# Patient Record
Sex: Male | Born: 1970 | Race: White | Hispanic: No | Smoking: Former smoker
Health system: Southern US, Community
[De-identification: ages and names within clinical notes are randomized; demographics above are authoritative.]

## PROBLEM LIST (undated history)

## (undated) DIAGNOSIS — F329 Major depressive disorder, single episode, unspecified: Secondary | ICD-10-CM

## (undated) DIAGNOSIS — T7840XA Allergy, unspecified, initial encounter: Secondary | ICD-10-CM

## (undated) DIAGNOSIS — F419 Anxiety disorder, unspecified: Secondary | ICD-10-CM

## (undated) DIAGNOSIS — F32A Depression, unspecified: Secondary | ICD-10-CM

## (undated) DIAGNOSIS — I10 Essential (primary) hypertension: Secondary | ICD-10-CM

## (undated) DIAGNOSIS — E7800 Pure hypercholesterolemia, unspecified: Secondary | ICD-10-CM

## (undated) HISTORY — PX: FRACTURE SURGERY: SHX138

## (undated) HISTORY — DX: Essential (primary) hypertension: I10

## (undated) HISTORY — DX: Anxiety disorder, unspecified: F41.9

## (undated) HISTORY — PX: ANKLE FRACTURE SURGERY: SHX122

## (undated) HISTORY — DX: Depression, unspecified: F32.A

## (undated) HISTORY — DX: Allergy, unspecified, initial encounter: T78.40XA

## (undated) HISTORY — PX: APPENDECTOMY: SHX54

---
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
## 2006-11-03 ENCOUNTER — Emergency Department: Payer: Self-pay | Admitting: Emergency Medicine

## 2009-04-11 ENCOUNTER — Encounter: Admission: RE | Admit: 2009-04-11 | Discharge: 2009-04-11 | Payer: Self-pay | Admitting: Orthopedic Surgery

## 2009-08-22 ENCOUNTER — Encounter (INDEPENDENT_AMBULATORY_CARE_PROVIDER_SITE_OTHER): Payer: Self-pay | Admitting: *Deleted

## 2009-09-12 ENCOUNTER — Ambulatory Visit: Payer: Self-pay | Admitting: Gastroenterology

## 2009-09-12 DIAGNOSIS — R109 Unspecified abdominal pain: Secondary | ICD-10-CM | POA: Insufficient documentation

## 2009-09-12 LAB — CONVERTED CEMR LAB
Alkaline Phosphatase: 61 units/L (ref 39–117)
BUN: 17 mg/dL (ref 6–23)
Basophils Relative: 1.7 % (ref 0.0–3.0)
Creatinine, Ser: 1 mg/dL (ref 0.4–1.5)
Eosinophils Relative: 4.7 % (ref 0.0–5.0)
Glucose, Bld: 79 mg/dL (ref 70–99)
IgA: 177 mg/dL (ref 68–378)
Lymphocytes Relative: 29.9 % (ref 12.0–46.0)
Neutrophils Relative %: 56.7 % (ref 43.0–77.0)
Sed Rate: 12 mm/hr (ref 0–22)
Tissue Transglutaminase Ab, IgA: 0.2 units (ref <7)
Total Bilirubin: 0.5 mg/dL (ref 0.3–1.2)

## 2009-09-14 ENCOUNTER — Ambulatory Visit: Payer: Self-pay | Admitting: Internal Medicine

## 2009-09-19 ENCOUNTER — Telehealth: Payer: Self-pay | Admitting: Gastroenterology

## 2009-10-02 ENCOUNTER — Encounter (INDEPENDENT_AMBULATORY_CARE_PROVIDER_SITE_OTHER): Payer: Self-pay | Admitting: *Deleted

## 2009-10-03 ENCOUNTER — Ambulatory Visit: Payer: Self-pay | Admitting: Gastroenterology

## 2009-10-10 ENCOUNTER — Ambulatory Visit: Payer: Self-pay | Admitting: Gastroenterology

## 2009-11-20 ENCOUNTER — Ambulatory Visit: Payer: Self-pay | Admitting: Gastroenterology

## 2010-08-20 NOTE — Assessment & Plan Note (Signed)
History of Present Illness Visit Type: Initial Visit Primary GI MD: Rob Bunting MD Primary Provider: n/a Chief Complaint: abdominal pain radiating to back History of Present Illness:     very pleasant 40 year old man who is here with upper back pain that are always relieved when he has a BM.  who has been having very bad back problems right.left upper back pains.  He will have a BM and this will help his pain.    He has spinal MRI at Dr. Cheron Schaumann office. Had a CT chest at [facility]. tells me these were normal  Has not had abd imaging.    recently had blood test by PCP.  He will usually have 6-7 formed, stools, every day (non-bloody).  This has been his pattern for a long time (at least into his twenties.  Was told he had colitis in college: present with abd pains, increased stool frequency.  Did not have colonoscopy.  Only an xray was done. had symptoms for 8-9 months (no bleeding at that time).  Overall he has been losing weight, unintentially 20 pounds in a year.             Current Medications (verified): 1)  None  Allergies (verified): 1)  ! * Peanuts 2)  ! * Milk  Past History:  Past Medical History: hypertension Colitis? when in college  Past Surgical History: none  Family History: no colon cancer No colitis  Social History: he is married he has a 21-year-old son he works as an Metallurgist setting up Therapist, nutritional for concerts, he smokes less than one pack a day, he does not drink alcohol, he drinks 3-4 caffeinated beverages a day  Review of Systems       Pertinent positive and negative review of systems were noted in the above HPI and GI specific review of systems.  All other review of systems was otherwise negative.   Vital Signs:  Patient profile:   40 year old male Height:      71 inches Weight:      191.13 pounds BMI:     26.75 Pulse rate:   80 / minute Pulse rhythm:   regular BP sitting:   148 / 108  (left arm)  Vitals Entered  By: Milford Cage NCMA (September 12, 2009 11:06 AM)  Physical Exam  Additional Exam:  Constitutional: generally well appearing Psychiatric: alert and oriented times 3 Eyes: extraocular movements intact Mouth: oropharynx moist, no lesions Neck: supple, no lymphadenopathy Cardiovascular: heart regular rate and rythm Lungs: CTA bilaterally Abdomen: soft, non-tender, non-distended, no obvious ascites, no peritoneal signs, normal bowel sounds Extremities: no lower extremity edema bilaterally Skin: no lesions on visible extremities    Impression & Recommendations:  Problem # 1:  Back pain that is always relieved when he has a BM this is unusual complex of symptoms. Perhaps he has a diaphragmatic hernia with some of his colon in his chest and tinging on the spine. That could explain his symptoms. I would've expected that to show up on a chest CT scan however. I think it is reasonable for him to have a abdominal, pelvic CT scan to check for anatomic abnormality such as this. If that is negative then we will proceed with colonoscopy at his soonest convenience. He does have his question of colitis in his college years although he never underwent any type of tissue sampling, flexible sigmoidoscopy or colonoscopy to prove it. He'll get a basic set of labs including a CBC,  complete metabolic profile and celiac sprue testing.  Other Orders: TLB-CBC Platelet - w/Differential (85025-CBCD) TLB-CMP (Comprehensive Metabolic Pnl) (80053-COMP) TLB-Sedimentation Rate (ESR) (85652-ESR) T-Tissue Transglutamase Ab IgA (84132-44010) TLB-IgA (Immunoglobulin A) (82784-IGA)  Patient Instructions: 1)  You will get lab test(s) done today (cbc, cmet, esr, ttg, total IgA level). 2)  You will be scheduled for a CT scan of abdomen and pelvis with IV and oral contrast (diaphragmatic hernia?). 3)  IF the CT is normal, you will need a colonoscopy. 4)  The medication list was reviewed and reconciled.  All changed / newly  prescribed medications were explained.  A complete medication list was provided to the patient / caregiver.  Appended Document: Orders Update/CT    Clinical Lists Changes  Orders: Added new Referral order of CT Abdomen/Pelvis with Contrast (CT Abd/Pelvis w/con) - Signed      Appended Document:  labs all normal.  please call.  Should continue with the suggestions outlined at recent visit.  Appended Document:  pt aware

## 2010-08-20 NOTE — Miscellaneous (Signed)
Summary: [facility]  Clinical Lists Changes  Medications: Added new medication of MOVIPREP 100 GM  SOLR (PEG-KCL-NACL-NASULF-NA ASC-C) As per prep instructions. - Signed Rx of MOVIPREP 100 GM  SOLR (PEG-KCL-NACL-NASULF-NA ASC-C) As per prep instructions.;  #1 x 0;  Signed;  Entered by: Ezra Sites RN;  Authorized by: Rachael Fee MD;  Method used: Electronically to [facility]. # [phone]*, [address], [place], [place], [place]  [address], Ph: [phone], Fax: [phone] Observations: Added new observation of ALLERGY REV: Done (10/03/2009 13:13)    Prescriptions: MOVIPREP 100 GM  SOLR (PEG-KCL-NACL-NASULF-NA ASC-C) As per prep instructions.  #1 x 0   Entered by:   Ezra Sites RN   Authorized by:   Rachael Fee MD   Signed by:   Ezra Sites RN on 10/03/2009   Method used:   Electronically to        [facility]. # [phone]* (retail)       [address]       [place], [place]  [address]       Ph: [phone]       Fax: [phone]   RxID:   [id number]

## 2010-08-20 NOTE — Letter (Signed)
Summary: New Patient letter  [facility]  [address] [place], [place] [address]   Phone: [phone]  Fax: [phone]       08/22/2009 MRN: [id number]  Bryan Mccoy [address] [place], [place]  [address]  Dear Bryan Mccoy,  Welcome to the Gastroenterology Division at [facility]    You are scheduled to see Dr. Rob Bunting on September 12, 2009 at 11:00am  on the 3rd floor at [facility], [address]  We ask that you try to arrive at our office 15 minutes prior to your appointment time to allow for check-in.  We would like you to complete the enclosed self-administered evaluation form prior to your visit and bring it with you on the day of your appointment.  We will review it with you.  Also, please bring a complete list of all your medications or, if you prefer, bring the medication bottles and we will list them.  Please bring your insurance card so that we may make a copy of it.  If your insurance requires a referral to see a specialist, please bring your referral form from your primary care physician.  Co-payments are due at the time of your visit and may be paid by cash, check or credit card.     Your office visit will consist of a consult with your physician (includes a physical exam), any laboratory testing he/she may order, scheduling of any necessary diagnostic testing (e.g. x-ray, ultrasound, CT-scan), and scheduling of a procedure (e.g. Endoscopy, Colonoscopy) if required.  Please allow enough time on your schedule to allow for any/all of these possibilities.    If you cannot keep your appointment, please call [phone] to cancel or reschedule prior to your appointment date.  This allows [place] the opportunity to schedule an appointment for another patient in need of care.  If you do not cancel or reschedule by 5 p.m. the business day prior to your appointment date, you will be charged a $50.00 late cancellation/no-show fee.    Thank you for  choosing [facility] for your medical needs.  We appreciate the opportunity to care for you.  Please visit [place] at our website  to learn more about our practice.                     Sincerely,                                                             The Gastroenterology Division

## 2010-08-20 NOTE — Letter (Signed)
Summary: [facility] Instructions  [facility]  [address] [place], [place] [address]   Phone: [phone]  Fax: [phone]       Bryan Mccoy    [birth year]    MRN: [id number]        Procedure Day /Date:  10/10/09     Arrival Time:  1pm      Procedure Time:  2pm     Location of Procedure:                    _x _  [facility] (4th Floor)   PREPARATION FOR COLONOSCOPY WITH MOVIPREP   Starting 5 days prior to your procedure _3/18/11 _ do not eat nuts, seeds, popcorn, corn, beans, peas,  salads, or any raw vegetables.  Do not take any fiber supplements (e.g. Metamucil, Citrucel, and Benefiber).  THE DAY BEFORE YOUR PROCEDURE         DATE:  10/09/09  DAY:   Tuesday  1.  Drink clear liquids the entire day-NO SOLID FOOD  2.  Do not drink anything colored red or purple.  Avoid juices with pulp.  No orange juice.  3.  Drink at least 64 oz. (8 glasses) of fluid/clear liquids during the day to prevent dehydration and help the prep work efficiently.  CLEAR LIQUIDS INCLUDE: Water Jello Ice Popsicles Tea (sugar ok, no milk/cream) Powdered fruit flavored drinks Coffee (sugar ok, no milk/cream) Gatorade Juice: apple, white grape, white cranberry  Lemonade Clear bullion, consomm, broth Carbonated beverages (any kind) Strained chicken noodle soup Hard Candy                             4.  In the morning, mix first dose of MoviPrep solution:    Empty 1 Pouch A and 1 Pouch B into the disposable container    Add lukewarm drinking water to the top line of the container. Mix to dissolve    Refrigerate (mixed solution should be used within 24 hrs)  5.  Begin drinking the prep at 5:00 p.m. The MoviPrep container is divided by 4 marks.   Every 15 minutes drink the solution down to the next mark (approximately 8 oz) until the full liter is complete.   6.  Follow completed prep with 16 oz of clear liquid of your choice (Nothing red or purple).  Continue to  drink clear liquids until bedtime.  7.  Before going to bed, mix second dose of MoviPrep solution:    Empty 1 Pouch A and 1 Pouch B into the disposable container    Add lukewarm drinking water to the top line of the container. Mix to dissolve    Refrigerate  THE DAY OF YOUR PROCEDURE      DATE:   10/10/09 DAY:   Wednesday  Beginning at  9:00 a.m. (5 hours before procedure):         1. Every 15 minutes, drink the solution down to the next mark (approx 8 oz) until the full liter is complete.  2. Follow completed prep with 16 oz. of clear liquid of your choice.    3. You may drink clear liquids until  12n (2 HOURS BEFORE PROCEDURE).   MEDICATION INSTRUCTIONS  Unless otherwise instructed, you should take regular prescription medications with a small sip of water   as early as possible the morning of your procedure.         OTHER  INSTRUCTIONS  You will need a responsible adult at least 40 years of age to accompany you and drive you home.   This person must remain in the waiting room during your procedure.  Wear loose fitting clothing that is easily removed.  Leave jewelry and other valuables at home.  However, you may wish to bring a book to read or  an iPod/MP3 player to listen to music as you wait for your procedure to start.  Remove all body piercing jewelry and leave at home.  Total time from sign-in until discharge is approximately 2-3 hours.  You should go home directly after your procedure and rest.  You can resume normal activities the  day after your procedure.  The day of your procedure you should not:   Drive   Make legal decisions   Operate machinery   Drink alcohol   Return to work  You will receive specific instructions about eating, activities and medications before you leave.    The above instructions have been reviewed and explained to me by   Ezra Sites RN  October 03, 2009 2:02 PM    I fully understand and can verbalize these  instructions _____________________________ Date _________

## 2010-08-20 NOTE — Procedures (Signed)
Summary: Colonoscopy  Patient: Bryan Mccoy Note: All result statuses are Final unless otherwise noted.  Tests: (1) Colonoscopy (COL)   COL Colonoscopy           DONE     [facility]     [address]     [place], [place]  [address]           COLONOSCOPY PROCEDURE REPORT           PATIENT:  Bryan Mccoy, Bryan Mccoy  MR#:  [id number]     BIRTHDATE:  06-Sep-1970, 38 yrs. old  GENDER:  male     ENDOSCOPIST:  Rachael Fee, MD           PROCEDURE DATE:  10/10/2009     PROCEDURE:  Colonoscopy with biopsy     ASA CLASS:  Class II     INDICATIONS:  chronic loose stools; back pain that is always     improved with BM     MEDICATIONS:   Fentanyl 75 mcg IV, Versed 7 mg IV     DESCRIPTION OF PROCEDURE:   After the risks benefits and     alternatives of the procedure were thoroughly explained, informed     consent was obtained.  Digital rectal exam was performed and     revealed no rectal masses.   The LB [id number] endoscope     was introduced through the anus and advanced to the terminal ileum     which was intubated for a short distance, without limitations.     The quality of the prep was good, using MoviPrep.  The instrument     was then slowly withdrawn as the colon was fully examined.     <<PROCEDUREIMAGES>>     FINDINGS:  The terminal ileum appeared normal (see image3).  A     normal appearing cecum, ileocecal valve, and appendiceal orifice     were identified. The ascending, hepatic flexure, transverse,     splenic flexure, descending, sigmoid colon, and rectum appeared     unremarkable. Random colon biopsies taken (pathology jar 1) (see     image1, image2, and image4).   Retroflexed views in the rectum     revealed no abnormalities.    The scope was then withdrawn from     the patient and the procedure completed.           COMPLICATIONS:  None     ENDOSCOPIC IMPRESSION:     1) Normal terminal ileum     2) Normal colon, random biopsies taken        RECOMMENDATIONS:     Await biopsies.  Will start appropriate medicines pending biopsy     reports (budesonide if microscopic colitis found, scheduled     immodium if no microscopic colitis.     I do not see any explanation for his back pain (CT scan recently     was normal).           ______________________________     Rachael Fee, MD           n.     eSIGNED:   Rachael Fee at 10/10/2009 02:25 PM           Turley, Onalee Hua, [id number]  Note: An exclamation mark (!) indicates a result that was not dispersed into the flowsheet. Document Creation Date: 10/10/2009 2:25 PM _______________________________________________________________________  (1) Order result status: Final Collection or observation date-time: 10/10/2009 14:17 Requested date-time:  Receipt  date-time:  Reported date-time:  Referring Physician:   Ordering Physician: Rob Bunting [phone]) Specimen Source:  Source: Launa Grill Order Number: [id number] Lab site:

## 2010-08-20 NOTE — Progress Notes (Signed)
Summary: results  Phone Note Call from Patient Call back at Home Phone [phone]   Caller: Patient Call For: Christella Hartigan Reason for Call: Talk to Nurse Summary of Call: Patient wants ct results from 2-25  Initial call taken by: Tawni Levy,  September 19, 2009 11:23 AM  Follow-up for Phone Call        pt informed that Dr Christella Hartigan was out of town and would review the CT as soon as he is back.  I will see if the [facility] will review. ( Dr Marina Goodell) Follow-up by: Chales Abrahams CMA Duncan Dull),  September 19, 2009 12:12 PM  Additional Follow-up for Phone Call Additional follow up Details #1::        Dr Marina Goodell reviewed and results given to the pt. He will call back to schedule colon. Additional Follow-up by: Chales Abrahams CMA Duncan Dull),  September 19, 2009 12:28 PM     Appended Document: results patty, please set up colonoscopy.  also let him know that his labs have all returned normal as well.  Appended Document: results spoke with the pt he will call his wife and call back this afternoon to schedule  Appended Document: results pt scheduled for colon and previsit

## 2010-08-20 NOTE — Assessment & Plan Note (Signed)
  Review of gastrointestinal problems: 1. Chronic loose stools, back pain is always relieved when he moves his bowels. Colonoscopy March 2011 was normal to the terminal ileum. Random biopsies were normal.  CT Scan showed no obvious colon or small bowel pathology.    History of Present Illness Visit Type: Follow-up Visit Primary GI MD: Rob Bunting MD Primary Provider: n/a Chief Complaint: follow-up colonoscopy still not any better and losing weight History of Present Illness:     very pleasant 40 year old man whom I last saw the time of his colonoscopy. See those results above.  he started one immodium and became a bit constipated.  The back pains became worse also.  He stopped the immodium.    GI Review of Systems      Denies vomiting blood.         Current Medications (verified): 1)  None  Allergies (verified): 1)  ! * Peanuts 2)  ! * Milk  Vital Signs:  Patient profile:   40 year old male Height:      71 inches Weight:      188.25 pounds BMI:     26.35 Pulse rate:   80 / minute Pulse rhythm:   regular BP sitting:   158 / 100  (left arm)  Vitals Entered By: Milford Cage NCMA (Nov 20, 2009 1:53 PM)  Physical Exam  Additional Exam:  Constitutional: generally well appearing Psychiatric: alert and oriented times 3 Abdomen: soft, non-tender, non-distended, normal bowel sounds    Impression & Recommendations:  Problem # 1:  chronic loose stools, intermittent back pain his back pain is usually relieved when he moves his bowels. Is also quite positional and improves when he lays flat on his back for 10-15 minutes. He has undergone orthopedic, spinal workup and they do not think it is related to his spine. I've certainly not seen colon or intestinal spasm present with back pain before but perhaps that is indeed what is going on here. I have given him prescription for sublingual antispasmodics and she will take on an as-needed basis and he will contact me if things  worsen or do not improve with this medicine.  Patient Instructions: 1)  Cutting back on caffeine can decrease loose stools. 2)  Quitting smoking may help as well. 3)  We've called in an antispasm medicine, take as needed. Call Dr. Christella Hartigan if things worsen or aren't getting better on the new medicine. 4)  The medication list was reviewed and reconciled.  All changed / newly prescribed medications were explained.  A complete medication list was provided to the patient / caregiver. Prescriptions: LEVSIN/SL 0.125 MG  SUBL (HYOSCYAMINE SULFATE) take one to two pills every 5-6 hours as needed  #75 x 3   Entered and Authorized by:   Rachael Fee MD   Signed by:   Rachael Fee MD on 11/20/2009   Method used:   Electronically to        [facility]. # [phone]* (retail)       [address]       [place], [place]  [address]       Ph: [phone]       Fax: [phone]   RxID:   [id number]

## 2011-03-08 IMAGING — CT CT ABD-PELV W/ CM
2 of 4 series · 17 of 46 positions shown, 19 images · IV contrast (Omnipaque 300)
Comparison: None

CLINICAL DATA: Abdominal pain.  Weight loss.

CT ABDOMEN AND PELVIS WITH CONTRAST
TECHNIQUE: Multidetector CT imaging of the abdomen and pelvis was
performed following the standard protocol during bolus
administration of intravenous contrast.
Contrast: 100 ml of 8mnipaque-4GG

[Series 2: abd/ pel · axial · 0.80mm/px · z∈[-473,-43]mm · 14 of 94 slices shown, 16 images]
[im 4/94  soft-tissue]
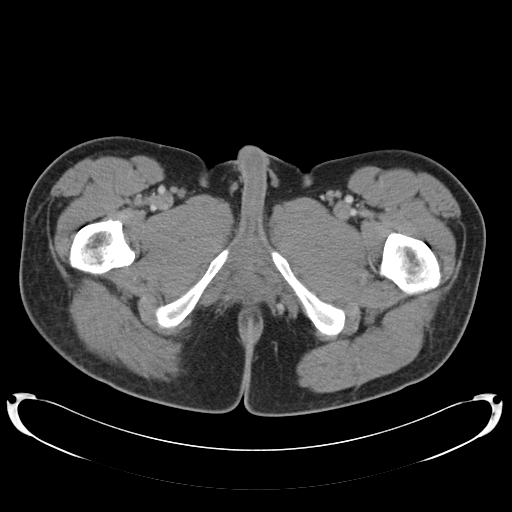
[im 4/94  bone]
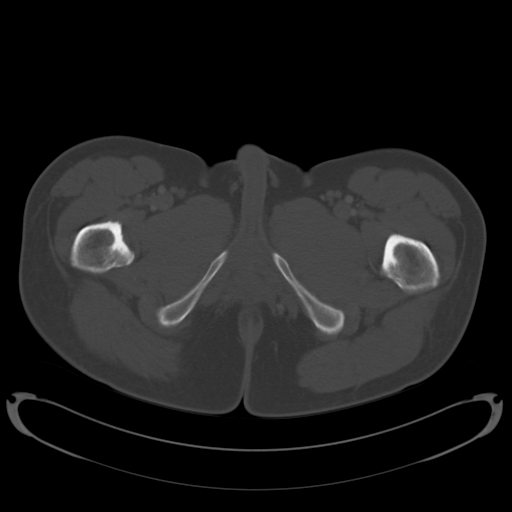
[im 12/94  soft-tissue]
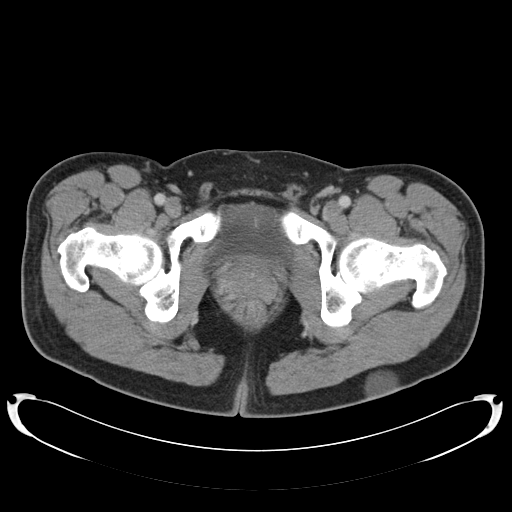
[im 20/94  soft-tissue]
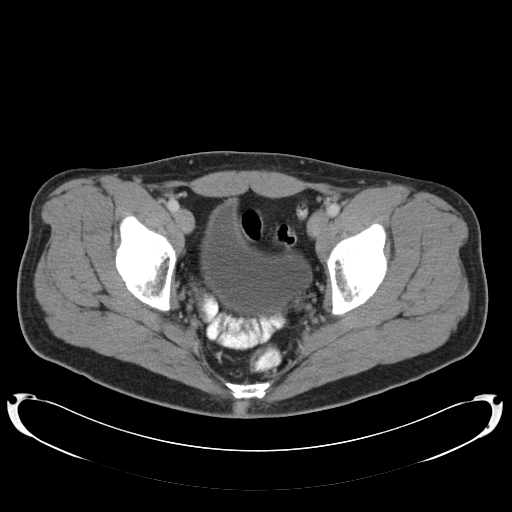
[im 24/94  soft-tissue]
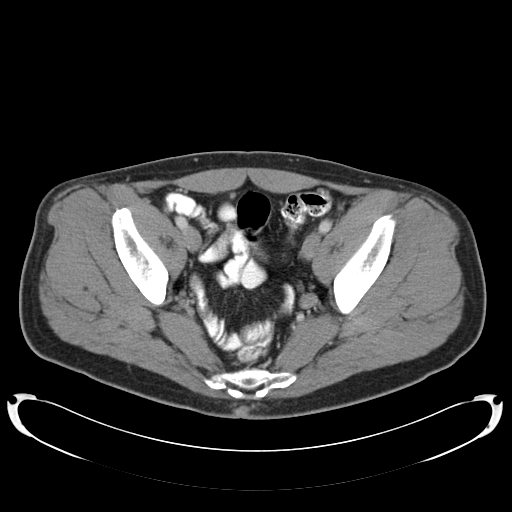
[im 32/94  soft-tissue]
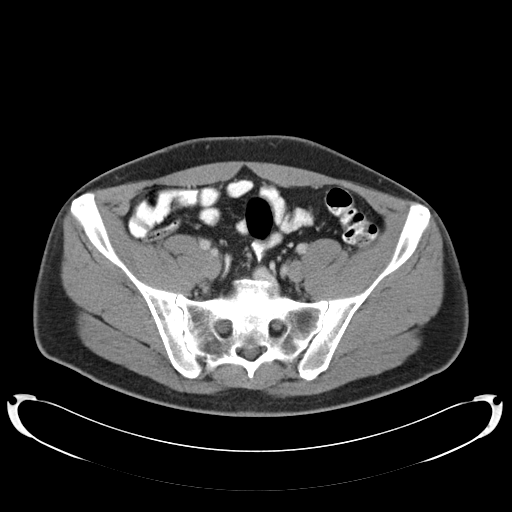
[im 39/94  soft-tissue]
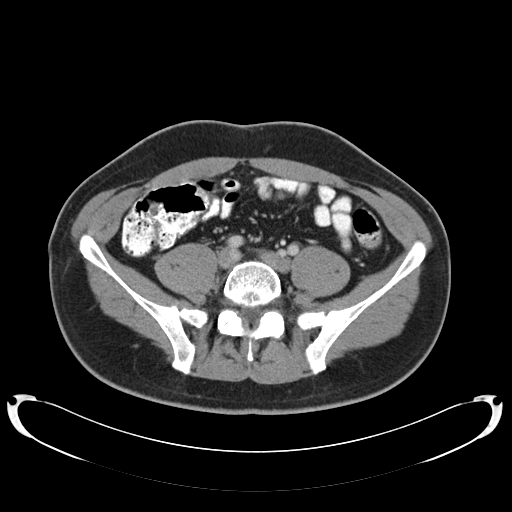
[im 43/94  soft-tissue]
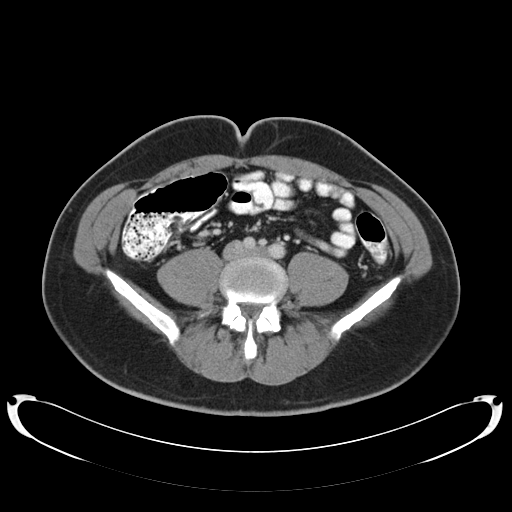
[im 51/94  soft-tissue]
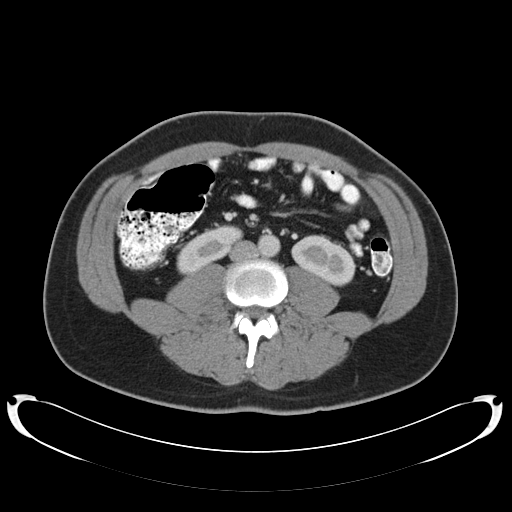
[im 55/94  soft-tissue]
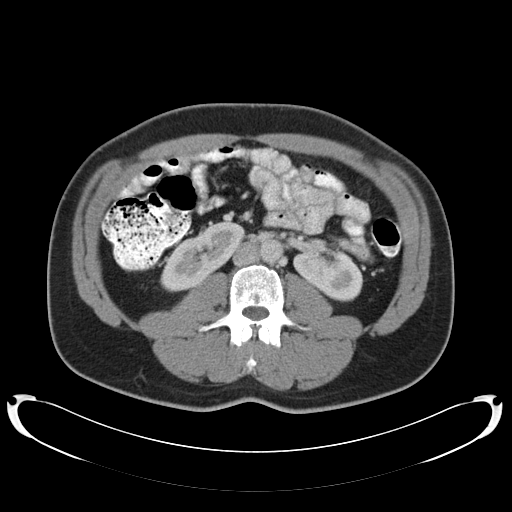
[im 55/94  bone]
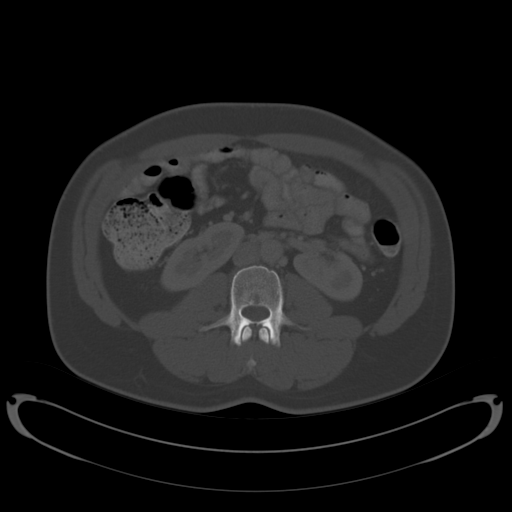
[im 63/94  soft-tissue]
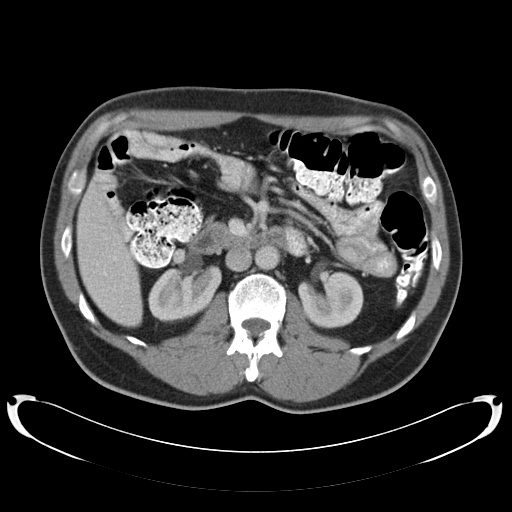
[im 70/94  soft-tissue]
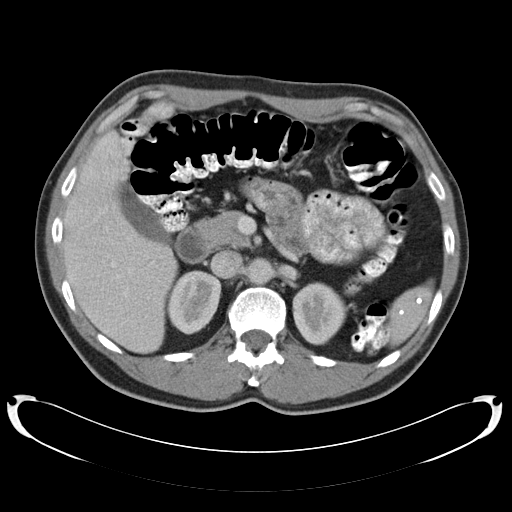
[im 74/94  soft-tissue]
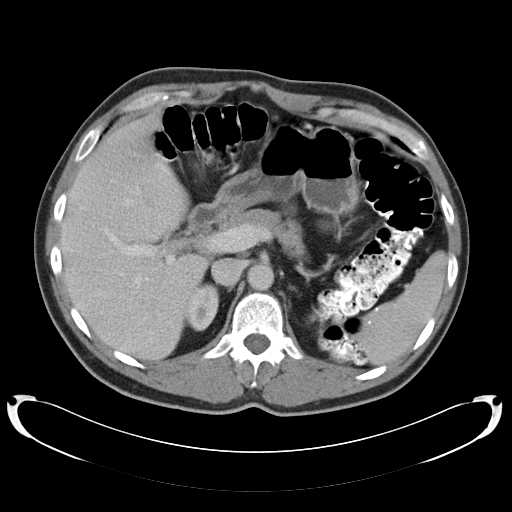
[im 82/94  soft-tissue]
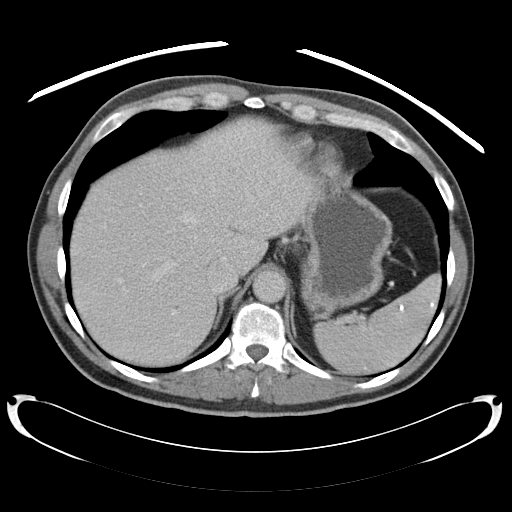
[im 90/94  soft-tissue]
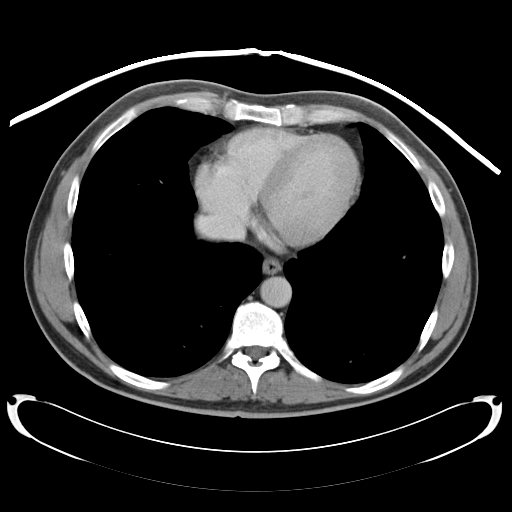

[Series 602: <mpr range> · coronal · 0.93mm/px · 3 of 141 slices shown]
[im 47/141  soft-tissue]
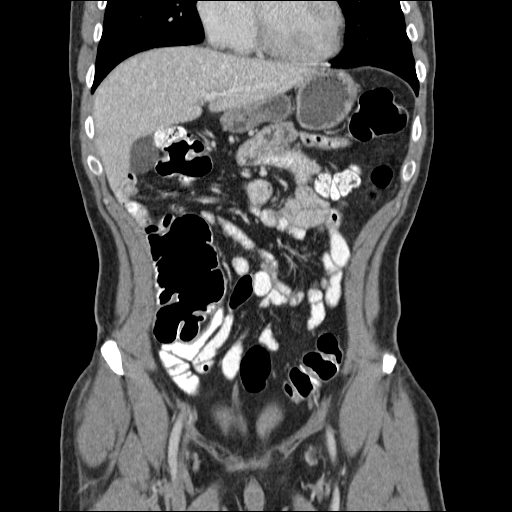
[im 63/141  soft-tissue]
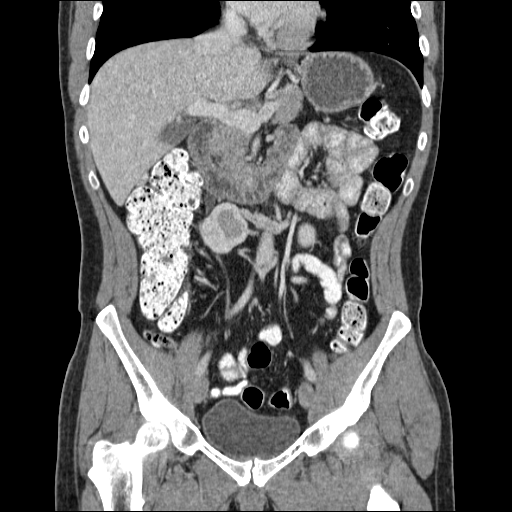
[im 78/141  soft-tissue]
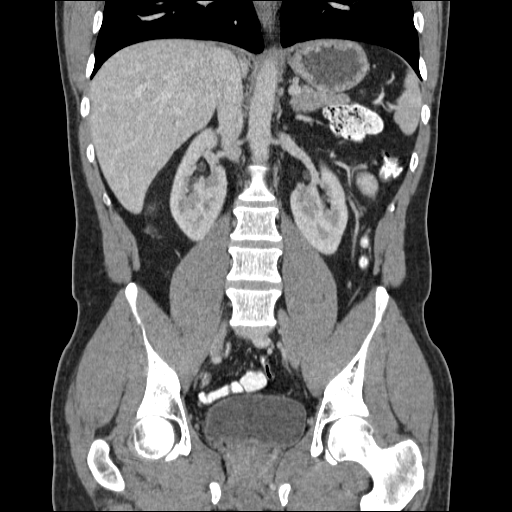

[17 of 46 positions shown; findings below may reference images not displayed]

FINDINGS: Lung bases:Clear. The diaphragm is normal.  No hernia is seen.

Solid abdominal organs:The liver is unremarkable.  No biliary
dilatation.  The gallbladder is normal.  There are numerous small
calcified granulomas in the spleen.  The spleen is normal in size.
The pancreas, adrenal glands and kidneys are unremarkable.  Both
kidneys demonstrate mild under-rotation and there appears to be a
band of tissue connected and inferiorly this is likely a form of
horseshoe kidney.

GI tract:The stomach, duodenum, small bowel and colon are
unremarkable.  No mesenteric mass or adenopathy.  The appendix is
normal.  The rectum and sigmoid colon are unremarkable.

Aorta/retroperitoneal:The aorta is normal in caliber.  No
dissection.  The major branch vessels are normal.  No
retroperitoneal mass or adenopathy.

A circumaortic left renal vein is noted.

Pelvis:The bladder, prostate gland and seminal vesicles are
unremarkable.  No inguinal hernia. There is a large subcutaneous
cystic lesion in the left buttock area overlying the gluteus
maximus muscle which is likely a large sebaceous cyst.

Bony structures:Unremarkable.
IMPRESSION: 1.  No acute abdominal/pelvic findings, mass lesions or adenopathy.
2.  A variant of a horseshoe kidney deformity is noted
3.  4 cm probable sebaceous cyst in the left buttock area.

## 2019-06-02 ENCOUNTER — Encounter: Payer: Self-pay | Admitting: Family Medicine

## 2019-06-02 ENCOUNTER — Other Ambulatory Visit: Payer: Self-pay

## 2019-06-02 ENCOUNTER — Ambulatory Visit (INDEPENDENT_AMBULATORY_CARE_PROVIDER_SITE_OTHER): Payer: 59 | Admitting: Family Medicine

## 2019-06-02 VITALS — BP 132/84 | HR 82 | Temp 98.5°F | Ht 71.0 in | Wt 220.4 lb

## 2019-06-02 DIAGNOSIS — I10 Essential (primary) hypertension: Secondary | ICD-10-CM

## 2019-06-02 DIAGNOSIS — Z87891 Personal history of nicotine dependence: Secondary | ICD-10-CM

## 2019-06-02 DIAGNOSIS — Z7689 Persons encountering health services in other specified circumstances: Secondary | ICD-10-CM | POA: Diagnosis not present

## 2019-06-02 MED ORDER — ENALAPRIL-HYDROCHLOROTHIAZIDE 5-12.5 MG PO TABS: 1.0000 | ORAL_TABLET | Freq: Every day | ORAL | 1 refills | Status: DC

## 2019-06-02 NOTE — Patient Instructions (Addendum)
If you have lab work done today you will be contacted with your lab results within the next 2 weeks.  If you have not heard from [place] then please contact us. The fastest way to get your results is to register for My Chart.   IF you received an x-ray today, you will receive an invoice from [facility]. Please contact [facility] at [phone] with questions or concerns regarding your invoice.   IF you received labwork today, you will receive an invoice from [employer]. Please contact [facility] at [phone] with questions or concerns regarding your invoice.   Our billing staff will not be able to assist you with questions regarding bills from these companies.  You will be contacted with the lab results as soon as they are available. The fastest way to get your results is to activate your My Chart account. Instructions are located on the last page of this paperwork. If you have not heard from [place] regarding the results in 2 weeks, please contact this office.     DASH Eating Plan DASH stands for "Dietary Approaches to Stop Hypertension." The DASH eating plan is a healthy eating plan that has been shown to reduce high blood pressure (hypertension). It may also reduce your risk for type 2 diabetes, heart disease, and stroke. The DASH eating plan may also help with weight loss. What are tips for following this plan?  General guidelines  Avoid eating more than 2,300 mg (milligrams) of salt (sodium) a day. If you have hypertension, you may need to reduce your sodium intake to 1,500 mg a day.  Limit alcohol intake to no more than 1 drink a day for nonpregnant women and 2 drinks a day for men. One drink equals 12 oz of beer, 5 oz of wine, or 1 oz of hard liquor.  Work with your health care provider to maintain a healthy body weight or to lose weight. Ask what an ideal weight is for you.  Get at least 30 minutes of exercise that causes your heart to beat faster (aerobic  exercise) most days of the week. Activities may include walking, swimming, or biking.  Work with your health care provider or diet and nutrition specialist (dietitian) to adjust your eating plan to your individual calorie needs. Reading food labels   Check food labels for the amount of sodium per serving. Choose foods with less than 5 percent of the Daily Value of sodium. Generally, foods with less than 300 mg of sodium per serving fit into this eating plan.  To find whole grains, look for the word "whole" as the first word in the ingredient list. Shopping  Buy products labeled as "low-sodium" or "no salt added."  Buy fresh foods. Avoid canned foods and premade or frozen meals. Cooking  Avoid adding salt when cooking. Use salt-free seasonings or herbs instead of table salt or sea salt. Check with your health care provider or pharmacist before using salt substitutes.  Do not fry foods. Cook foods using healthy methods such as baking, boiling, grilling, and broiling instead.  Cook with heart-healthy oils, such as olive, canola, soybean, or sunflower oil. Meal planning  Eat a balanced diet that includes: ? 5 or more servings of fruits and vegetables each day. At each meal, try to fill half of your plate with fruits and vegetables. ? Up to 6-8 servings of whole grains each day. ? Less than 6 oz of lean meat, poultry, or fish each day. A 3-oz serving  of meat is about the same size as a deck of cards. One egg equals 1 oz. ? 2 servings of low-fat dairy each day. ? A serving of nuts, seeds, or beans 5 times each week. ? Heart-healthy fats. Healthy fats called Omega-3 fatty acids are found in foods such as flaxseeds and coldwater fish, like sardines, salmon, and mackerel.  Limit how much you eat of the following: ? Canned or prepackaged foods. ? Food that is high in trans fat, such as fried foods. ? Food that is high in saturated fat, such as fatty meat. ? Sweets, desserts, sugary drinks,  and other foods with added sugar. ? Full-fat dairy products.  Do not salt foods before eating.  Try to eat at least 2 vegetarian meals each week.  Eat more home-cooked food and less restaurant, buffet, and fast food.  When eating at a restaurant, ask that your food be prepared with less salt or no salt, if possible. What foods are recommended? The items listed may not be a complete list. Talk with your dietitian about what dietary choices are best for you. Grains Whole-grain or whole-wheat bread. Whole-grain or whole-wheat pasta. Brown rice. [place]. Bulgur. Whole-grain and low-sodium cereals. Pita bread. Low-fat, low-sodium crackers. Whole-wheat flour tortillas. Vegetables Fresh or frozen vegetables (raw, steamed, roasted, or grilled). Low-sodium or reduced-sodium tomato and vegetable juice. Low-sodium or reduced-sodium tomato sauce and tomato paste. Low-sodium or reduced-sodium canned vegetables. Fruits All fresh, dried, or frozen fruit. Canned fruit in natural juice (without added sugar). Meat and other protein foods Skinless chicken or [place]. Ground chicken or [place]. Pork with fat trimmed off. Fish and seafood. Egg whites. Dried beans, peas, or lentils. Unsalted nuts, nut butters, and seeds. Unsalted canned beans. Lean cuts of beef with fat trimmed off. Low-sodium, lean deli meat. Dairy Low-fat (1%) or fat-free (skim) milk. Fat-free, low-fat, or reduced-fat cheeses. Nonfat, low-sodium ricotta or cottage cheese. Low-fat or nonfat yogurt. Low-fat, low-sodium cheese. Fats and oils Soft margarine without trans fats. Vegetable oil. Low-fat, reduced-fat, or light mayonnaise and salad dressings (reduced-sodium). Canola, safflower, olive, soybean, and sunflower oils. Avocado. Seasoning and other foods Herbs. Spices. Seasoning mixes without salt. Unsalted popcorn and pretzels. Fat-free sweets. What foods are not recommended? The items listed may not be a complete list. Talk with your  dietitian about what dietary choices are best for you. Grains Baked goods made with fat, such as croissants, muffins, or some breads. Dry pasta or rice meal packs. Vegetables Creamed or fried vegetables. Vegetables in a cheese sauce. Regular canned vegetables (not low-sodium or reduced-sodium). Regular canned tomato sauce and paste (not low-sodium or reduced-sodium). Regular tomato and vegetable juice (not low-sodium or reduced-sodium). Angie Fava. Olives. Fruits Canned fruit in a light or heavy syrup. Fried fruit. Fruit in cream or butter sauce. Meat and other protein foods Fatty cuts of meat. Ribs. Fried meat. Berniece Salines. Sausage. Bologna and other processed lunch meats. Salami. Fatback. Hotdogs. Bratwurst. Salted nuts and seeds. Canned beans with added salt. Canned or smoked fish. Whole eggs or egg yolks. Chicken or [place] with skin. Dairy Whole or 2% milk, cream, and half-and-half. Whole or full-fat cream cheese. Whole-fat or sweetened yogurt. Full-fat cheese. Nondairy creamers. Whipped toppings. Processed cheese and cheese spreads. Fats and oils Butter. Stick margarine. Lard. Shortening. Ghee. Bacon fat. Tropical oils, such as coconut, palm kernel, or palm oil. Seasoning and other foods Salted popcorn and pretzels. Onion salt, garlic salt, seasoned salt, table salt, and sea salt. Worcestershire sauce. Tartar sauce. Barbecue sauce.  Teriyaki sauce. Soy sauce, including reduced-sodium. Steak sauce. Canned and packaged gravies. Fish sauce. Oyster sauce. Cocktail sauce. Horseradish that you find on the shelf. Ketchup. Mustard. Meat flavorings and tenderizers. Bouillon cubes. Hot sauce and Tabasco sauce. Premade or packaged marinades. Premade or packaged taco seasonings. Relishes. Regular salad dressings. Where to find more information:  National Heart, Lung, and [place]: https://wilson-eaton.com/  American Heart Association: www.heart.org Summary  The DASH eating plan is a healthy eating plan that has  been shown to reduce high blood pressure (hypertension). It may also reduce your risk for type 2 diabetes, heart disease, and stroke.  With the DASH eating plan, you should limit salt (sodium) intake to 2,300 mg a day. If you have hypertension, you may need to reduce your sodium intake to 1,500 mg a day.  When on the DASH eating plan, aim to eat more fresh fruits and vegetables, whole grains, lean proteins, low-fat dairy, and heart-healthy fats.  Work with your health care provider or diet and nutrition specialist (dietitian) to adjust your eating plan to your individual calorie needs. This information is not intended to replace advice given to you by your health care provider. Make sure you discuss any questions you have with your health care provider. Document Released: 06/26/2011 Document Revised: 06/19/2017 Document Reviewed: 06/30/2016 Elsevier Patient Education  2020 [employer].

## 2019-06-02 NOTE — Progress Notes (Signed)
11/12/202010:01 AM  Bryan Mccoy April 26, 1971, 48 y.o., male [id number]  Chief Complaint  Patient presents with  . Establish Care    moved from [place], completed release form. Taking htn meds, has 2 days left. Taking enalapril 5-12.65m. Mentioned anxiety and depression in history, forms completed. Last labs done 1 month ago    HPI:   Patient is a 48y.o. male with past medical history significant for HTN, depression, anxiety, migraines who presents today to establish care  [place], works from home  Last PCP visit was a month ago, reports BP at goal then Has gained weight since working from home, at 220 lbs for past 6 months Takes enalapril-htcz 5-12.56mdaily No issues with depression and anxiety, PHQ9=2, GAD7=2 Most recent labs he was told something was a "little high" - advised LFM Does not get anaphylaxis from food allergies Migraines triggered by foods, avoids, takes NSAIDs as needed Wears contacts, sees eye yearly Has to establish with a new dentist Former smoker, quit about 10 years, smoked 1.5 ppd x 10 years Rare etoh use, denies illicit drug use Declines flu vaccine  [facility] 06/02[facility]  Decreased Interest 0  Down, Depressed, Hopeless 0  PHQ - 2 Score 0    Fall Risk  06/02/2019  Falls in the past year? 0  Injury with Fall? 0     Allergies  Allergen Reactions  . Milk-Related Compounds   . Peanut-Containing Drug Products     Prior to Admission medications   Medication Sig Start Date End Date Taking? Authorizing Provider  Enalapril-hydroCHLOROthiazide 5-12.5 MG tablet Take 1 tablet by mouth daily.   Yes [provider]    Past Medical History:  Diagnosis Date  . Allergy   . Anxiety   . Depression   . Hypertension     Past Surgical History:  Procedure Laterality Date  . ANKLE FRACTURE SURGERY     right  . APPENDECTOMY       Social History   Tobacco Use  . Smoking status: Former Smoker    Packs/day: 1.50    Years: 10.00    Pack years: 15.00    Types: Cigarettes    Quit date: 2010    Years since quitting: 10.8  . Smokeless tobacco: Never Used  Substance Use Topics  . Alcohol use: Yes    Family History  Problem Relation Age of Onset  . Hyperlipidemia Mother   . Hypertension Mother   . Hyperlipidemia Brother     Review of Systems  Constitutional: Negative for chills and fever.  Respiratory: Negative for cough and shortness of breath.   Cardiovascular: Negative for chest pain, palpitations and leg swelling.  Gastrointestinal: Negative for abdominal pain, nausea and vomiting.  All other systems reviewed and are negative.    OBJECTIVE:  Today's Vitals   06/02/19 0941  BP: 132/84  Pulse: 82  Temp: 98.5 F (36.9 C)  SpO2: 98%  Weight: 220 lb 6.4 oz (100 kg)  Height: 5' 11"  (1.803 m)   Body mass index is 30.74 kg/m.   Physical Exam Vitals signs and nursing note reviewed.  Constitutional:      Appearance: He is well-developed.  HENT:     Head: Normocephalic and atraumatic.     Right Ear: Hearing, tympanic membrane, ear canal and external ear normal.     Left Ear: Hearing, tympanic membrane, ear canal and  external ear normal.     Mouth/Throat:     Pharynx: No oropharyngeal exudate.  Eyes:     Conjunctiva/sclera: Conjunctivae normal.     Pupils: Pupils are equal, round, and reactive to light.  Neck:     Musculoskeletal: Neck supple.     Thyroid: No thyromegaly.  Cardiovascular:     Rate and Rhythm: Normal rate and regular rhythm.     Heart sounds: Normal heart sounds. No murmur. No friction rub. No gallop.   Pulmonary:     Effort: Pulmonary effort is normal.     Breath sounds: Normal breath sounds. No wheezing, rhonchi or rales.  Abdominal:     General: Bowel sounds are normal. There is no distension.     Palpations: Abdomen is soft. There is no mass.     Tenderness: There is  no abdominal tenderness.  Musculoskeletal: Normal range of motion.     Right lower leg: No edema.     Left lower leg: No edema.  Lymphadenopathy:     Cervical: No cervical adenopathy.  Skin:    General: Skin is warm and dry.  Neurological:     Mental Status: He is alert and oriented to person, place, and time.     Gait: Gait normal.     Deep Tendon Reflexes: Reflexes are normal and symmetric.  Psychiatric:        Mood and Affect: Mood normal.        Behavior: Behavior normal.     No results found for this or any previous visit (from the past 24 hour(s)).  No results found.   ASSESSMENT and PLAN  1. Essential hypertension, benign Controlled. Continue current regime. Discussed LFM.  2. Former smoker 15 pack history. Discussed AAA screen at age 80  3. Encounter to establish care with new doctor PMH, PSH, meds, allergies, Fhx, Shx, reviewed with patient today. Medical records requested  Other orders - Enalapril-hydroCHLOROthiazide 5-12.5 MG tablet; Take 1 tablet by mouth daily.  Return in about 6 months (around 11/30/2019).    Rutherford Guys, MD Primary Care at [place], [place] [address] Ph.  [phone] Fax [phone]

## 2019-09-02 ENCOUNTER — Other Ambulatory Visit: Payer: Self-pay

## 2019-09-02 ENCOUNTER — Encounter: Payer: Self-pay | Admitting: Family Medicine

## 2019-09-02 ENCOUNTER — Ambulatory Visit (INDEPENDENT_AMBULATORY_CARE_PROVIDER_SITE_OTHER): Payer: 59 | Admitting: Family Medicine

## 2019-09-02 VITALS — BP 134/90 | HR 97 | Temp 97.9°F | Ht 71.0 in | Wt 220.2 lb

## 2019-09-02 DIAGNOSIS — R1031 Right lower quadrant pain: Secondary | ICD-10-CM | POA: Diagnosis not present

## 2019-09-02 LAB — POCT URINALYSIS DIP (MANUAL ENTRY)
Bilirubin, UA: NEGATIVE
Glucose, UA: NEGATIVE mg/dL
Ketones, POC UA: NEGATIVE mg/dL
Leukocytes, UA: NEGATIVE
Nitrite, UA: NEGATIVE
Protein Ur, POC: 100 mg/dL — AB
Spec Grav, UA: >=1.03 — AB (ref 1.010–1.025)
Urobilinogen, UA: 0.2 E.U./dL
pH, UA: 5.5 (ref 5.0–8.0)

## 2019-09-02 LAB — POC MICROSCOPIC URINALYSIS (UMFC): Mucus: ABSENT

## 2019-09-02 NOTE — Progress Notes (Signed)
2/12/20218:53 AM  Youlanda Roys Boomhower [birth year], 49 y.o., male [id number]  Chief Complaint  Patient presents with  . Groin Pain    not sure of onset, denies falling or heavy lifting    HPI:   Patient is a 49 y.o. male with past medical history significant for HTN who presents today for right groin pain  About a month ago was having right sided groin pain after carrying his daughter down stairs, since then having recurring pain, noticed a lump in his right testicle about 2.5 weeks ago, he feels that it has enlarged since then No fever, chills, nausea or vomiting occ loose stools, no constipation, no abd pain No dysuria or hematuria  Depression screen [facility] 2/9 06/02/2019 06/02/2019  Decreased Interest 0 0  Down, Depressed, Hopeless 1 0  PHQ - 2 Score 1 0  Altered sleeping 0 -  Tired, decreased energy 1 -  Change in appetite 0 -  Feeling bad or failure about yourself  0 -  Trouble concentrating 0 -  Moving slowly or fidgety/restless 0 -  Suicidal thoughts 0 -  PHQ-9 Score 2 -  Difficult doing work/chores Somewhat difficult -    Fall Risk  06/02/2019  Falls in the past year? 0  Injury with Fall? 0     Allergies  Allergen Reactions  . Milk-Related Compounds   . Peanut-Containing Drug Products     Prior to Admission medications   Medication Sig Start Date End Date Taking? Authorizing Provider  Enalapril-hydroCHLOROthiazide 5-12.5 MG tablet Take 1 tablet by mouth daily. 06/02/19  Yes Rutherford Guys, MD    Past Medical History:  Diagnosis Date  . Allergy   . Anxiety   . Depression   . Hypertension     Past Surgical History:  Procedure Laterality Date  . ANKLE FRACTURE SURGERY     right  . APPENDECTOMY      Social History   Tobacco Use  . Smoking status: Former Smoker    Packs/day: 1.50    Years: 10.00    Pack years: 15.00    Types: Cigarettes    Quit date: 2010    Years since quitting: 11.1  . Smokeless tobacco: Never Used  Substance Use Topics    . Alcohol use: Yes    Family History  Problem Relation Age of Onset  . Hyperlipidemia Mother   . Hypertension Mother   . Hyperlipidemia Brother     ROS Per hpi  OBJECTIVE:  Today's Vitals   09/02/19 0850  BP: 134/90  Pulse: 97  Temp: 97.9 F (36.6 C)  SpO2: 97%  Weight: 220 lb 3.2 oz (99.9 kg)  Height: 5' 11"  (1.803 m)   Body mass index is 30.71 kg/m.   Physical Exam Vitals and nursing note reviewed. Exam conducted with a chaperone present.  Constitutional:      Appearance: He is well-developed.  HENT:     Head: Normocephalic and atraumatic.  Eyes:     Conjunctiva/sclera: Conjunctivae normal.     Pupils: Pupils are equal, round, and reactive to light.  Cardiovascular:     Rate and Rhythm: Normal rate and regular rhythm.     Heart sounds: No murmur. No friction rub. No gallop.   Pulmonary:     Effort: Pulmonary effort is normal.     Breath sounds: Normal breath sounds. No wheezing, rhonchi or rales.  Abdominal:     General: Bowel sounds are normal. There is no distension.     Palpations:  Abdomen is soft.     Tenderness: There is no abdominal tenderness.     Hernia: A hernia is present. Hernia is present in the right inguinal area. There is no hernia in the left inguinal area.  Genitourinary:    Penis: Normal.      Testes: Normal. Cremasteric reflex is present.     Epididymis:     Right: Normal.     Left: Normal.  Musculoskeletal:     Cervical back: Neck supple.  Lymphadenopathy:     Lower Body: No right inguinal adenopathy. No left inguinal adenopathy.  Skin:    General: Skin is warm and dry.  Neurological:     Mental Status: He is alert and oriented to person, place, and time.     Results for orders placed or performed in visit on 09/02/19 (from the past 24 hour(s))  POCT urinalysis dipstick     Status: Abnormal   Collection Time: 09/02/19  9:05 AM  Result Value Ref Range   Color, UA yellow yellow   Clarity, UA clear clear   Glucose, UA  negative negative mg/dL   Bilirubin, UA negative negative   Ketones, POC UA negative negative mg/dL   Spec Grav, UA >=1.030 (A) 1.010 - 1.025   Blood, UA moderate (A) negative   pH, UA 5.5 5.0 - 8.0   Protein Ur, POC =100 (A) negative mg/dL   Urobilinogen, UA 0.2 0.2 or 1.0 E.U./dL   Nitrite, UA Negative Negative   Leukocytes, UA Negative Negative  POCT Microscopic Urinalysis ([facility])     Status: Abnormal   Collection Time: 09/02/19  9:28 AM  Result Value Ref Range   WBC,UR,HPF,POC None None WBC/hpf   RBC,UR,HPF,POC None None RBC/hpf   Bacteria None None, Too numerous to count   Mucus Absent Absent   Epithelial Cells, UR Per Microscopy Few (A) None, Too numerous to count cells/hpf    No results found.   ASSESSMENT and PLAN  1. Right inguinal pain Exam favors inguinal hernia. [place] to confirm, referral to gen surg pending results. Hernia precautions given. RTC precautions reviewed.  - POCT urinalysis dipstick - US Scrotum; Future - POCT Microscopic Urinalysis ([facility])  No follow-ups on file.    Rutherford Guys, MD Primary Care at [place], [place] [address] Ph.  [phone] Fax [phone]

## 2019-09-02 NOTE — Patient Instructions (Signed)
° ° ° °  If you have lab work done today you will be contacted with your lab results within the next 2 weeks.  If you have not heard from us then please contact us. The fastest way to get your results is to register for My Chart. ° ° °IF you received an x-ray today, you will receive an invoice from [facility]. Please contact [facility] at [phone] with questions or concerns regarding your invoice.  ° °IF you received labwork today, you will receive an invoice from [facility]. Please contact [facility] at [phone] with questions or concerns regarding your invoice.  ° °Our billing staff will not be able to assist you with questions regarding bills from these companies. ° °You will be contacted with the lab results as soon as they are available. The fastest way to get your results is to activate your My Chart account. Instructions are located on the last page of this paperwork. If you have not heard from us regarding the results in 2 weeks, please contact this office. °  ° ° ° °

## 2019-09-09 ENCOUNTER — Telehealth: Payer: Self-pay | Admitting: Family Medicine

## 2019-09-09 NOTE — Telephone Encounter (Signed)
Erica from [facility] calling to ask new order / order should be for ultrasound pelvis limited for DX that is given .

## 2019-09-14 ENCOUNTER — Telehealth: Payer: Self-pay | Admitting: Family Medicine

## 2019-09-14 DIAGNOSIS — R1031 Right lower quadrant pain: Secondary | ICD-10-CM

## 2019-09-14 NOTE — Telephone Encounter (Signed)
This order has been placed and pt notified

## 2019-09-14 NOTE — Telephone Encounter (Signed)
Order has been changed. And called pt to let him know. Pt stated understanding.

## 2019-09-14 NOTE — Telephone Encounter (Signed)
Pt calling in regards to the referral for an ultrasound . Said that he called [facility] and they told him that the order was noted wrong and that it should have said pelvic and not groin   Please advise

## 2019-09-16 ENCOUNTER — Other Ambulatory Visit: Payer: Self-pay

## 2019-09-19 ENCOUNTER — Other Ambulatory Visit: Payer: Self-pay | Admitting: Family Medicine

## 2019-09-19 ENCOUNTER — Encounter: Payer: Self-pay | Admitting: Family Medicine

## 2019-09-19 ENCOUNTER — Ambulatory Visit
Admission: RE | Admit: 2019-09-19 | Discharge: 2019-09-19 | Disposition: A | Discharge status: Home / Self Care | Payer: Managed Care, Other (non HMO) | Source: Ambulatory Visit | Attending: Family Medicine | Admitting: Family Medicine

## 2019-09-19 DIAGNOSIS — R1031 Right lower quadrant pain: Secondary | ICD-10-CM

## 2019-12-01 ENCOUNTER — Ambulatory Visit: Payer: 59 | Admitting: Family Medicine

## 2019-12-01 ENCOUNTER — Encounter: Payer: Self-pay | Admitting: Nurse Practitioner

## 2019-12-01 ENCOUNTER — Telehealth (INDEPENDENT_AMBULATORY_CARE_PROVIDER_SITE_OTHER): Payer: 59 | Admitting: Nurse Practitioner

## 2019-12-01 VITALS — BP 172/123 | HR 67 | Temp 98.2°F | Ht 70.1 in | Wt 225.8 lb

## 2019-12-01 DIAGNOSIS — Z7689 Persons encountering health services in other specified circumstances: Secondary | ICD-10-CM | POA: Diagnosis not present

## 2019-12-01 DIAGNOSIS — M25562 Pain in left knee: Secondary | ICD-10-CM | POA: Insufficient documentation

## 2019-12-01 DIAGNOSIS — Z13220 Encounter for screening for lipoid disorders: Secondary | ICD-10-CM

## 2019-12-01 DIAGNOSIS — I10 Essential (primary) hypertension: Secondary | ICD-10-CM | POA: Insufficient documentation

## 2019-12-01 MED ORDER — ENALAPRIL MALEATE 5 MG PO TABS: 5.0000 mg | ORAL_TABLET | Freq: Every day | ORAL | 4 refills | Status: DC

## 2019-12-01 MED ORDER — PREDNISONE 10 MG PO TABS: ORAL_TABLET | ORAL | 0 refills | Status: DC

## 2019-12-01 NOTE — Progress Notes (Signed)
Going through separation from wife, although they still live in same household

## 2019-12-01 NOTE — Assessment & Plan Note (Signed)
Chronic, ongoing with recent BPs at previous provider visits close to goal range.  Due to gout flares will discontinue HCTZ and change to Enalapril only, adjust dose as needed based on BP readings.  Recommend focus on DASH diet and that he monitor BP at home on daily basis + document for provider.  Script sent for Enalapril.  Obtain TSH, CMP, lipid panel today.  Return in 4 weeks for follow-up.

## 2019-12-01 NOTE — Progress Notes (Signed)
New Patient Office Visit  Subjective:  Patient ID: Bryan Mccoy, male    DOB: 04/24/1971  Age: 49 y.o. MRN: [id number]  CC:  Chief Complaint  Patient presents with  . Establish Care  . Knee Pain    L knee, states he has been having pain in it for a while now   . Hypertension    . This visit was completed via MyChart due to the restrictions of the COVID-19 pandemic. All issues as above were discussed and addressed. Physical exam was done as above through visual confirmation on MyChart. If it was felt that the patient should be evaluated in the office, they were directed there. The patient verbally consented to this visit. Did have technical difficulties about 15 minutes into MyChart visit and changed to telephone. . Location of the patient: home . Location of the provider: home . Those involved with this call:  . Provider: Marnee Guarneri, DNP . CMA: Yvonna Alanis, CMA . Front Desk/Registration: Don Perking  . Time spent on call: 30 minutes with patient face to face via video conference. More than 50% of this time was spent in counseling and coordination of care. 15 minutes total spent in review of patient's record and preparation of their chart.  . I verified patient identity using two factors (patient name and date of birth). Patient consents verbally to being seen via telemedicine visit today.    HPI Bryan Mccoy presents for new patient visit to establish care.  Introduced to Designer, jewellery role and practice setting.  All questions answered.  Discussed provider/patient relationship and expectations.  Previously followed by Dr. Pamella Pert at [facility] in [place], established there on 06/02/2019.  Currently is living in [place] and building home there.  HYPERTENSION Continues on Enalapril-HCTZ 5-12.5 MG, been on for 7-8 years.   Hypertension status: stable  Satisfied with current treatment? yes Duration of hypertension: chronic BP monitoring frequency:  not checking BP  range:  BP medication side effects:  no Medication compliance: good compliance Previous BP meds: Aspirin: no Recurrent headaches: no Visual changes: no Palpitations: no Dyspnea: no Chest pain: no Lower extremity edema: no Dizzy/lightheaded: no   KNEE PAIN Had noticed this initially during when Covid first hit, was drinking a little more alcohol at times (a drink a night).  Flares started with toe at that time.  Then last week knee had flare.  Reports pain to left knee, he is concerned for gout.  Has had instances where big toe has gotten swollen, this is how knee was recently.  Has been trying to eat healthier, eating seafood, had shrimp two days in a row and knee flared up. The swelling is better now, but still tender with bending and going up and down stairs. Duration: days Involved knee: left Mechanism of injury: unknown Location:diffuse -- at times right at top of knee Onset: sudden Severity: 10/10  Quality:  sharp, burning and throbbing Frequency: intermittent Radiation: no Aggravating factors: weight bearing, walking, bending and movement  Alleviating factors: ice, NSAIDs and rest  Status: fluctuating Treatments attempted: ice and ibuprofen  Relief with NSAIDs?:  moderate Weakness with weight bearing or walking: yes Sensation of giving way: no Locking: no Popping: no Bruising: no Swelling: yes Redness: yes Paresthesias/decreased sensation: no Fevers: no   Past Medical History:  Diagnosis Date  . Allergy   . Anxiety   . Depression   . Hypertension     Past Surgical History:  Procedure Laterality Date  . ANKLE FRACTURE  SURGERY     right  . APPENDECTOMY      Family History  Problem Relation Age of Onset  . Hyperlipidemia Mother   . Hypertension Mother   . Migraines Sister   . Hyperlipidemia Brother   . Allergies Son   . Lung cancer Maternal Grandfather   . Diabetes Paternal Grandmother   . Alzheimer's disease Paternal Grandfather     Social  History   Socioeconomic History  . Marital status: Married    Spouse name: Not on file  . Number of children: 2  . Years of education: Not on file  . Highest education level: Not on file  Occupational History  . Not on file  Tobacco Use  . Smoking status: Former Smoker    Packs/day: 1.50    Years: 10.00    Pack years: 15.00    Types: Cigarettes    Quit date: 2010    Years since quitting: 11.3  . Smokeless tobacco: Never Used  Substance and Sexual Activity  . Alcohol use: Yes    Comment: very rare  . Drug use: Never  . Sexual activity: Yes  Other Topics Concern  . Not on file  Social History Narrative  . Not on file   Social Determinants of Health   Financial Resource Strain: Low Risk   . Difficulty of Paying Living Expenses: Not hard at all  Food Insecurity: No Food Insecurity  . Worried About Charity fundraiser in the Last Year: Never true  . Ran Out of Food in the Last Year: Never true  Transportation Needs: No Transportation Needs  . Lack of Transportation (Medical): No  . Lack of Transportation (Non-Medical): No  Physical Activity: Insufficiently Active  . Days of Exercise per Week: 3 days  . Minutes of Exercise per Session: 30 min  Stress: Stress Concern Present  . Feeling of Stress : Rather much  Social Connections: Slightly Isolated  . Frequency of Communication with Friends and Family: Three times a week  . Frequency of Social Gatherings with Friends and Family: Three times a week  . Attends Religious Services: More than 4 times per year  . Active Member of Clubs or Organizations: No  . Attends Archivist Meetings: Never  . Marital Status: Married  Human resources officer Violence:   . Fear of Current or Ex-Partner:   . Emotionally Abused:   Marland Kitchen Physically Abused:   . Sexually Abused:     ROS Review of Systems  Constitutional: Negative for activity change, diaphoresis, fatigue and fever.  Respiratory: Negative for cough, chest tightness,  shortness of breath and wheezing.   Cardiovascular: Negative for chest pain, palpitations and leg swelling.  Gastrointestinal: Negative.   Musculoskeletal: Positive for arthralgias.  Neurological: Negative.   Psychiatric/Behavioral: Negative.     Objective:   Today's Vitals: There were no vitals taken for this visit.  Physical Exam Vitals and nursing note reviewed.  Constitutional:      General: He is awake. He is not in acute distress.    Appearance: He is well-developed. He is not ill-appearing.  HENT:     Head: Normocephalic.     Right Ear: Hearing normal. No drainage.     Left Ear: Hearing normal. No drainage.  Eyes:     General: Lids are normal.        Right eye: No discharge.        Left eye: No discharge.     Conjunctiva/sclera: Conjunctivae normal.  Pulmonary:  Effort: Pulmonary effort is normal. No accessory muscle usage or respiratory distress.  Musculoskeletal:     Cervical back: Normal range of motion.     Comments: Unable to view left knee as video stopped working.  Neurological:     Mental Status: He is alert and oriented to person, place, and time.  Psychiatric:        Mood and Affect: Mood normal.        Behavior: Behavior normal. Behavior is cooperative.        Thought Content: Thought content normal.        Judgment: Judgment normal.     Assessment & Plan:   Problem List Items Addressed This Visit      Cardiovascular and Mediastinum   Essential hypertension    Chronic, ongoing with recent BPs at previous provider visits close to goal range.  Due to gout flares will discontinue HCTZ and change to Enalapril only, adjust dose as needed based on BP readings.  Recommend focus on DASH diet and that he monitor BP at home on daily basis + document for provider.  Script sent for Enalapril.  Obtain TSH, CMP, lipid panel today.  Return in 4 weeks for follow-up.      Relevant Medications   enalapril (VASOTEC) 5 MG tablet   Other Relevant Orders    Comprehensive metabolic panel   TSH   Lipid Panel w/o Chol/HDL Ratio     Other   Left knee pain    Acute, with recurrent "gout like" flares per history.  Suspect gout flares based on patient history with flares to great toe and now recent to left knee.  No imaging at this time, will obtain if no improvement.  At this time obtain CBC and uric acid level.  Script for Prednisone sent and recommend cutting back on Aleeve due to his HTN.  Continue to ice knee as needed and rest, may wear knee brace if offering comfort.  Provided education and information on low purine diet to avoid gout flares.  Will discontinue HCTZ on BP medications and switch to Enalapril only, which can adjust as needed.  Return in 4 weeks, sooner if worsening symptoms.      Relevant Orders   Uric acid   CBC with Differential/Platelet    Other Visit Diagnoses    Encounter to establish care    -  Primary   Screening cholesterol level       Has not ate this morning and no recent cholesterol screen, will obtain on labs.   Relevant Orders   Lipid Panel w/o Chol/HDL Ratio      Outpatient Encounter Medications as of 12/01/2019  Medication Sig  . [DISCONTINUED] Enalapril-hydroCHLOROthiazide 5-12.5 MG tablet Take 1 tablet by mouth daily.  . enalapril (VASOTEC) 5 MG tablet Take 1 tablet (5 mg total) by mouth daily.  . predniSONE (DELTASONE) 10 MG tablet Take 6 tablets by mouth daily for 2 days, then reduce by 1 tablet every 2 days until gone   No facility-administered encounter medications on file as of 12/01/2019.   I discussed the assessment and treatment plan with the patient. The patient was provided an opportunity to ask questions and all were answered. The patient agreed with the plan and demonstrated an understanding of the instructions.   The patient was advised to call back or seek an in-person evaluation if the symptoms worsen or if the condition fails to improve as anticipated.   I provided 30+ minutes of time  during  this encounter.  Follow-up: Return in about 4 weeks (around 12/29/2019) for HTN and GOUT.   Venita Lick, NP

## 2019-12-01 NOTE — Patient Instructions (Signed)

## 2019-12-01 NOTE — Assessment & Plan Note (Signed)
Acute, with recurrent "gout like" flares per history.  Suspect gout flares based on patient history with flares to great toe and now recent to left knee.  No imaging at this time, will obtain if no improvement.  At this time obtain CBC and uric acid level.  Script for Prednisone sent and recommend cutting back on Aleeve due to his HTN.  Continue to ice knee as needed and rest, may wear knee brace if offering comfort.  Provided education and information on low purine diet to avoid gout flares.  Will discontinue HCTZ on BP medications and switch to Enalapril only, which can adjust as needed.  Return in 4 weeks, sooner if worsening symptoms.

## 2019-12-02 ENCOUNTER — Ambulatory Visit: Payer: Self-pay | Admitting: Physician Assistant

## 2019-12-02 LAB — COMPREHENSIVE METABOLIC PANEL
ALT: 17 IU/L (ref 0–44)
AST: 19 IU/L (ref 0–40)
Albumin/Globulin Ratio: 2 (ref 1.2–2.2)
Albumin: 4.8 g/dL (ref 4.0–5.0)
Alkaline Phosphatase: 99 IU/L (ref 39–117)
BUN/Creatinine Ratio: 18 (ref 9–20)
BUN: 17 mg/dL (ref 6–24)
Bilirubin Total: 0.3 mg/dL (ref 0.0–1.2)
CO2: 29 mmol/L (ref 20–29)
Calcium: 9.4 mg/dL (ref 8.7–10.2)
Chloride: 99 mmol/L (ref 96–106)
Creatinine, Ser: 0.97 mg/dL (ref 0.76–1.27)
GFR calc Af Amer: 106 mL/min/{1.73_m2} (ref >59)
GFR calc non Af Amer: 92 mL/min/{1.73_m2} (ref >59)
Globulin, Total: 2.4 g/dL (ref 1.5–4.5)
Glucose: 75 mg/dL (ref 65–99)
Potassium: 4.2 mmol/L (ref 3.5–5.2)
Sodium: 141 mmol/L (ref 134–144)
Total Protein: 7.2 g/dL (ref 6.0–8.5)

## 2019-12-02 LAB — CBC WITH DIFFERENTIAL/PLATELET
Basophils Absolute: 0.1 10*3/uL (ref 0.0–0.2)
Basos: 2 %
EOS (ABSOLUTE): 0.2 10*3/uL (ref 0.0–0.4)
Eos: 3 %
Hematocrit: 43.1 % (ref 37.5–51.0)
Hemoglobin: 14.4 g/dL (ref 13.0–17.7)
Immature Grans (Abs): 0.1 10*3/uL (ref 0.0–0.1)
Immature Granulocytes: 2 %
Lymphocytes Absolute: 1.9 10*3/uL (ref 0.7–3.1)
Lymphs: 24 %
MCH: 29.8 pg (ref 26.6–33.0)
MCHC: 33.4 g/dL (ref 31.5–35.7)
MCV: 89 fL (ref 79–97)
Monocytes Absolute: 0.6 10*3/uL (ref 0.1–0.9)
Monocytes: 7 %
Neutrophils Absolute: 5.1 10*3/uL (ref 1.4–7.0)
Neutrophils: 62 %
Platelets: 312 10*3/uL (ref 150–450)
RBC: 4.84 x10E6/uL (ref 4.14–5.80)
RDW: 12.9 % (ref 11.6–15.4)
WBC: 8.1 10*3/uL (ref 3.4–10.8)

## 2019-12-02 LAB — LIPID PANEL W/O CHOL/HDL RATIO
Cholesterol, Total: 218 mg/dL — ABNORMAL HIGH (ref 100–199)
HDL: 39 mg/dL — ABNORMAL LOW (ref >39)
LDL Chol Calc (NIH): 139 mg/dL — ABNORMAL HIGH (ref 0–99)
Triglycerides: 223 mg/dL — ABNORMAL HIGH (ref 0–149)
VLDL Cholesterol Cal: 40 mg/dL (ref 5–40)

## 2019-12-02 LAB — URIC ACID: Uric Acid: 8.9 mg/dL — ABNORMAL HIGH (ref 3.8–8.4)

## 2019-12-02 LAB — TSH: TSH: 1.82 u[IU]/mL (ref 0.450–4.500)

## 2019-12-02 NOTE — Progress Notes (Signed)
Contacted via [place] morning Jene, your labs have returned.  Uric acid, which we look at for gout, is elevated.  So definitely think that is our issue.  See if Prednisone offers help, it should.  I will recheck this level at next visit and if still elevated may place you on medication to help keep it down.  I suspect getting rid of the Hydrochlorothiazide will help too + diet changes.  Did you get the print out on low purine diet?  If not look this up online.    Remainder of labs look great, with exception of cholesterol levels.  Your LDL is above normal. The LDL is the bad cholesterol. Over time and in combination with inflammation and other factors, this contributes to plaque which in turn may lead to stroke and/or heart attack down the road. Sometimes high LDL is primarily genetic, and people might be eating all the right foods but still have high numbers. Other times, there is room for improvement in one's diet and eating healthier can bring this number down and potentially reduce one's risk of heart attack and/or stroke.   To reduce your LDL, Remember - more fruits and vegetables, more fish, and limit red meat and dairy products. More soy, nuts, beans, barley, lentils, oats and plant sterol ester enriched margarine instead of butter. I also encourage eliminating sugar and processed food. Remember, shop on the outside of the grocery store and visit your [employer]. If you would like to talk with me about dietary changes for your cholesterol, please let me know. We should recheck your cholesterol in 6 months.

## 2019-12-25 ENCOUNTER — Encounter: Payer: Self-pay | Admitting: Nurse Practitioner

## 2019-12-25 DIAGNOSIS — E782 Mixed hyperlipidemia: Secondary | ICD-10-CM | POA: Insufficient documentation

## 2019-12-25 DIAGNOSIS — E7800 Pure hypercholesterolemia, unspecified: Secondary | ICD-10-CM | POA: Insufficient documentation

## 2019-12-30 ENCOUNTER — Ambulatory Visit: Payer: PRIVATE HEALTH INSURANCE | Admitting: Nurse Practitioner

## 2020-05-18 ENCOUNTER — Ambulatory Visit
Admission: EM | Admit: 2020-05-18 | Discharge: 2020-05-18 | Disposition: A | Discharge status: Home / Self Care | Payer: Managed Care, Other (non HMO) | Attending: Family Medicine | Admitting: Family Medicine

## 2020-05-18 DIAGNOSIS — H938X3 Other specified disorders of ear, bilateral: Secondary | ICD-10-CM

## 2020-05-18 DIAGNOSIS — H6123 Impacted cerumen, bilateral: Secondary | ICD-10-CM

## 2020-05-18 NOTE — ED Provider Notes (Signed)
[facility]   [id number] 05/18/20 Arrival Time: 2706  CC: EAR PAIN  SUBJECTIVE: History from: patient.  Bryan Mccoy is a 49 y.o. male who presents with right ear fullness for the past 2 days. Denies a precipitating event, such as swimming or wearing ear plugs. Patient states the pain is constant and achy in character. Patient has not attempted OTC treatment for this. Symptoms are made worse with lying down. Reports similar symptoms in the past. Denies fever, chills, fatigue, sinus pain, rhinorrhea, ear discharge, sore throat, SOB, wheezing, chest pain, nausea, changes in bowel or bladder habits.    ROS: As per HPI.  All other pertinent ROS negative.     Past Medical History:  Diagnosis Date  . Allergy   . Anxiety   . Depression   . Hypertension    Past Surgical History:  Procedure Laterality Date  . ANKLE FRACTURE SURGERY     right  . APPENDECTOMY     Allergies  Allergen Reactions  . Milk-Related Compounds   . Peanut-Containing Drug Products    No current facility-administered medications on file prior to encounter.   Current Outpatient Medications on File Prior to Encounter  Medication Sig Dispense Refill  . enalapril (VASOTEC) 5 MG tablet Take 1 tablet (5 mg total) by mouth daily. 90 tablet 4  . predniSONE (DELTASONE) 10 MG tablet Take 6 tablets by mouth daily for 2 days, then reduce by 1 tablet every 2 days until gone 42 tablet 0   Social History   Socioeconomic History  . Marital status: Married    Spouse name: Not on file  . Number of children: 2  . Years of education: Not on file  . Highest education level: Not on file  Occupational History  . Not on file  Tobacco Use  . Smoking status: Former Smoker    Packs/day: 1.50    Years: 10.00    Pack years: 15.00    Types: Cigarettes    Quit date: 2010    Years since quitting: 11.8  . Smokeless tobacco: Never Used  Vaping Use  . Vaping Use: Never used  Substance and Sexual Activity  . Alcohol  use: Yes    Comment: very rare  . Drug use: Never  . Sexual activity: Yes  Other Topics Concern  . Not on file  Social History Narrative  . Not on file   Social Determinants of Health   Financial Resource Strain: Low Risk   . Difficulty of Paying Living Expenses: Not hard at all  Food Insecurity: No Food Insecurity  . Worried About Charity fundraiser in the Last Year: Never true  . Ran Out of Food in the Last Year: Never true  Transportation Needs: No Transportation Needs  . Lack of Transportation (Medical): No  . Lack of Transportation (Non-Medical): No  Physical Activity: Insufficiently Active  . Days of Exercise per Week: 3 days  . Minutes of Exercise per Session: 30 min  Stress: Stress Concern Present  . Feeling of Stress : Rather much  Social Connections: Moderately Integrated  . Frequency of Communication with Friends and Family: Three times a week  . Frequency of Social Gatherings with Friends and Family: Three times a week  . Attends Religious Services: More than 4 times per year  . Active Member of Clubs or Organizations: No  . Attends Archivist Meetings: Never  . Marital Status: Married  Human resources officer Violence:   . Fear of Current or  Ex-Partner: Not on file  . Emotionally Abused: Not on file  . Physically Abused: Not on file  . Sexually Abused: Not on file   Family History  Problem Relation Age of Onset  . Hyperlipidemia Mother   . Hypertension Mother   . Migraines Sister   . Hyperlipidemia Brother   . Allergies Son   . Lung cancer Maternal Grandfather   . Diabetes Paternal Grandmother   . Alzheimer's disease Paternal Grandfather     OBJECTIVE:  Vitals:   05/18/20 0852 05/18/20 0853  BP: (!) 170/134   Pulse: 96   Resp: 16   Temp: 98.9 F (37.2 C)   SpO2: 98%   Weight:  210 lb (95.3 kg)  Height:  5' 11"  (1.803 m)     General appearance: alert; appears fatigued HEENT: Ears: bilateral EACs , TMs pearly gray with visible cone of  light, without erythema; Eyes: PERRL, EOMI grossly; Sinuses nontender to palpation; Nose: clear rhinorrhea; Throat: oropharynx mildly erythematous, tonsils 1+ without white tonsillar exudates, uvula midline Neck: supple without LAD Lungs: unlabored respirations, symmetrical air entry; cough: absent; no respiratory distress Heart: regular rate and rhythm.  Radial pulses 2+ symmetrical bilaterally Skin: warm and dry Psychological: alert and cooperative; normal mood and affect  Imaging: No results found.   ASSESSMENT & PLAN:  1. Bilateral impacted cerumen   2. Ear fullness, bilateral    Ears irrigated in office today Clean ears 2-3 times per week with solution of 1/2 water, 1/2 peroxide to help prevent wax buildup Rest and drink plenty of fluids Continue to use OTC ibuprofen and/ or tylenol as needed for pain control Follow up with PCP if symptoms persists Return here or go to the ER if you have any new or worsening symptoms   Reviewed expectations re: course of current medical issues. Questions answered. Outlined signs and symptoms indicating need for more acute intervention. Patient verbalized understanding. After Visit Summary given.         Faustino Congress, NP 05/18/20 [phone]

## 2020-05-18 NOTE — ED Triage Notes (Signed)
Pt reports his R ear is clogged. sts he tried to clean out the wax last night but was unable. sts there is some slight pain to ear.

## 2020-05-18 NOTE — Discharge Instructions (Addendum)
We have cleaned out your ears in the office today.  To help prevent wax buildup, you may clean your ears with a solution of 1/2 water and 1/2 peroxide 2-3 times per week.  Follow up with this office or with primary care if symptoms are persisting.  Follow up in the ER for high fever, trouble swallowing, trouble breathing, other concerning symptoms.

## 2020-06-26 ENCOUNTER — Other Ambulatory Visit: Payer: Self-pay

## 2020-06-26 ENCOUNTER — Ambulatory Visit (INDEPENDENT_AMBULATORY_CARE_PROVIDER_SITE_OTHER): Payer: 59 | Admitting: Family Medicine

## 2020-06-26 ENCOUNTER — Encounter: Payer: Self-pay | Admitting: Family Medicine

## 2020-06-26 VITALS — BP 162/100 | HR 86 | Temp 98.2°F | Wt 234.0 lb

## 2020-06-26 DIAGNOSIS — I10 Essential (primary) hypertension: Secondary | ICD-10-CM

## 2020-06-26 DIAGNOSIS — M542 Cervicalgia: Secondary | ICD-10-CM | POA: Insufficient documentation

## 2020-06-26 DIAGNOSIS — Z23 Encounter for immunization: Secondary | ICD-10-CM

## 2020-06-26 DIAGNOSIS — M109 Gout, unspecified: Secondary | ICD-10-CM | POA: Diagnosis not present

## 2020-06-26 DIAGNOSIS — E7800 Pure hypercholesterolemia, unspecified: Secondary | ICD-10-CM | POA: Diagnosis not present

## 2020-06-26 DIAGNOSIS — Z1159 Encounter for screening for other viral diseases: Secondary | ICD-10-CM

## 2020-06-26 DIAGNOSIS — Z114 Encounter for screening for human immunodeficiency virus [HIV]: Secondary | ICD-10-CM

## 2020-06-26 MED ORDER — ENALAPRIL MALEATE 20 MG PO TABS: 20.0000 mg | ORAL_TABLET | Freq: Every day | ORAL | 0 refills | Status: DC

## 2020-06-26 NOTE — Assessment & Plan Note (Signed)
Most likely MSK etiology based on symptoms and exam. Recommend OTC pain relief as well as heating pad and stretching. RTC if no better.

## 2020-06-26 NOTE — Assessment & Plan Note (Signed)
Has had some diet changes, recheck labs today.

## 2020-06-26 NOTE — Assessment & Plan Note (Signed)
Remains uncontrolled despite increase in enalapril. Will increase to 35m and trial qhs dosing. Recommend weight loss, strategies discussed. Handout provided. Obtaining labs today. F/u in one month.

## 2020-06-26 NOTE — Patient Instructions (Signed)
It was great to see you!  Our plans for today:  - Increase your enalapril to 70m daily. Try taking this at night instead of in the morning. Since you took 130mthis morning, take 1072monight and then start taking 90m29m night on Wednesday. - Try a heating pad and neck stretches for your neck pain. Let us kKoreaw if this doesn't help. You can also take tylenol/ibuprofen. - Contact your pharmacy about getting your COVID booster. - We are checking some labs today, we will release these to your myChart. - See below for ways to increase your activity and an example of a healthy plate. - Follow up in one month.  Take care and seek immediate care sooner if you develop any concerns.   Dr. RumbKy Barbanere is an example of what a healthy plate looks like:    ? Make half your plate fruits and vegetables.     ? Focus on whole fruits.     ? Vary your veggies.  ? Make half your grains whole grains. -     ? Look for the word "whole" at the beginning of the ingredients list    ? Some whole-grain ingredients include whole oats, whole-wheat flour,        whole-grain corn, whole-grain brown rice, and whole rye.  ? Move to low-fat and fat-free milk or yogurt.  ? Vary your protein routine. - Meat, fish, poultry (chicken, turkKuwaitggs, beans (kidney, pinto), dairy.  ? Drink and eat less sodium, saturated fat, and added sugars.  Look for opportunities to move your body throughout your day:  Never lie down when you can sit; never sit when you can stand; never stand when you can pace.  Moving your body throughout the day is just as important as the 30 or 60 minutes of exercise at the gym!  Get social Get active with your friends instead of going out to eat. Go for a hike, walk around the mall, or play an exercise-themed video game.   Move more at work Fit more activity into the workday. Stand during phone calls, use a printer farther from your desk, and get up to stretch each hour.    Do  something new Develop a new skill to kick-start your motivation. Sign up for a class to learn how to salsHome Depotrf, do tai chi, or play a sport.    Keep cool in the pool Don't like to sweat? Hit the local community pool for a swim, water polo, or water aerobics class to stay cool while exercising.    Stay on track Use a fitness tracker (FITBIT, Fitness Pal mobile app) to track your activity and provide motivation to reach your goals.

## 2020-06-26 NOTE — Assessment & Plan Note (Signed)
Improved with discontinuation of HCTZ and diet changes. Will recheck uric acid levels. Continue to monitor.

## 2020-06-26 NOTE — Progress Notes (Signed)
   SUBJECTIVE:   CHIEF COMPLAINT / HPI: med questions  Past Medical History:  Diagnosis Date  . Allergy   . Anxiety   . Depression   . Hypertension    Hypertension: - Medications: enalapril 1m daily - Compliance: taking 2 pills currently.  - Checking BP at home: no - Denies any SOB, CP, LE edema, medication SEs, or symptoms of hypotension - thinks it worsens with stress. - some vision changes, has optho appt upcoming. - Exercise: exercise bike occasionally  Gout: Patient here for gout follow up. The patient reports no acute gout attacks since last clinic visit. Attacks occur primarily in the b/l great toes, knees. Limitation on activities include none. The patient is avoiding high purine foods and reports consuming 1-2 alcoholic drinks per month.  Neck pain - few weeks duration, worse with stress and at end of day - at night mainly, comes and goes. - L sided - went to chiropractor but couldn't be adjusted due to high BP. - acupuncture helped. - has tried biofreeze, ice packs, ibuprofen.  - no injuries.  - No fevers, night sweats, numbness/tingling  Healthcare Maintenance - Vaccines: due for COVID booster, flu, tdap - Colonoscopy: n/a   OBJECTIVE:   BP (!) 162/100   Pulse 86   Temp 98.2 F (36.8 C)   Wt 234 lb (106.1 kg)   SpO2 100%   BMI 32.64 kg/m   Gen: well appearing, in NAD Cardiac: RRR, no murmur Lungs: CTAB Abd: +BS, soft, nonTTP Ext: WWP, no edema MSK: slight tenderness to palpation of L paravertebral musculature with slight hypertonicity. Decreased neck rotation bilaterally. Full flexion, decreased active extension.   ASSESSMENT/PLAN:   Essential hypertension Remains uncontrolled despite increase in enalapril. Will increase to 233mand trial qhs dosing. Recommend weight loss, strategies discussed. Handout provided. Obtaining labs today. F/u in one month.  Elevated LDL cholesterol level Has had some diet changes, recheck labs today.  Neck  pain Most likely MSK etiology based on symptoms and exam. Recommend OTC pain relief as well as heating pad and stretching. RTC if no better.  Gout Improved with discontinuation of HCTZ and diet changes. Will recheck uric acid levels. Continue to monitor.    AlMyles GipDO

## 2020-06-27 LAB — BASIC METABOLIC PANEL
BUN/Creatinine Ratio: 16 (ref 9–20)
BUN: 14 mg/dL (ref 6–24)
CO2: 22 mmol/L (ref 20–29)
Calcium: 9 mg/dL (ref 8.7–10.2)
Chloride: 104 mmol/L (ref 96–106)
Creatinine, Ser: 0.9 mg/dL (ref 0.76–1.27)
GFR calc Af Amer: 116 mL/min/{1.73_m2} (ref >59)
GFR calc non Af Amer: 100 mL/min/{1.73_m2} (ref >59)
Glucose: 94 mg/dL (ref 65–99)
Potassium: 3.9 mmol/L (ref 3.5–5.2)
Sodium: 140 mmol/L (ref 134–144)

## 2020-06-27 LAB — LIPID PANEL
Chol/HDL Ratio: 6 ratio — ABNORMAL HIGH (ref 0.0–5.0)
Cholesterol, Total: 221 mg/dL — ABNORMAL HIGH (ref 100–199)
HDL: 37 mg/dL — ABNORMAL LOW (ref >39)
LDL Chol Calc (NIH): 134 mg/dL — ABNORMAL HIGH (ref 0–99)
Triglycerides: 276 mg/dL — ABNORMAL HIGH (ref 0–149)
VLDL Cholesterol Cal: 50 mg/dL — ABNORMAL HIGH (ref 5–40)

## 2020-06-27 LAB — URIC ACID: Uric Acid: 7.5 mg/dL (ref 3.8–8.4)

## 2020-06-27 LAB — HEPATITIS C ANTIBODY: Hep C Virus Ab: <0.1 s/co ratio (ref 0.0–0.9)

## 2020-06-27 LAB — HIV ANTIBODY (ROUTINE TESTING W REFLEX): HIV Screen 4th Generation wRfx: NONREACTIVE

## 2020-06-27 MED ORDER — ROSUVASTATIN CALCIUM 5 MG PO TABS: 5.0000 mg | ORAL_TABLET | Freq: Every day | ORAL | 3 refills | Status: DC

## 2020-06-27 NOTE — Addendum Note (Signed)
Addended by: Myles Gip on: 06/27/2020 09:29 AM   Modules accepted: Orders

## 2020-07-25 ENCOUNTER — Ambulatory Visit: Payer: PRIVATE HEALTH INSURANCE | Admitting: Nurse Practitioner

## 2020-07-26 ENCOUNTER — Encounter: Payer: Self-pay | Admitting: Nurse Practitioner

## 2020-07-26 ENCOUNTER — Other Ambulatory Visit: Payer: Self-pay

## 2020-07-26 ENCOUNTER — Ambulatory Visit (INDEPENDENT_AMBULATORY_CARE_PROVIDER_SITE_OTHER): Payer: 59 | Admitting: Nurse Practitioner

## 2020-07-26 VITALS — BP 140/88 | HR 79 | Temp 98.0°F | Ht 70.12 in | Wt 228.2 lb

## 2020-07-26 DIAGNOSIS — E6609 Other obesity due to excess calories: Secondary | ICD-10-CM | POA: Diagnosis not present

## 2020-07-26 DIAGNOSIS — E7800 Pure hypercholesterolemia, unspecified: Secondary | ICD-10-CM

## 2020-07-26 DIAGNOSIS — Z1211 Encounter for screening for malignant neoplasm of colon: Secondary | ICD-10-CM | POA: Diagnosis not present

## 2020-07-26 DIAGNOSIS — I10 Essential (primary) hypertension: Secondary | ICD-10-CM

## 2020-07-26 DIAGNOSIS — E669 Obesity, unspecified: Secondary | ICD-10-CM | POA: Insufficient documentation

## 2020-07-26 DIAGNOSIS — Z6832 Body mass index (BMI) 32.0-32.9, adult: Secondary | ICD-10-CM

## 2020-07-26 MED ORDER — ENALAPRIL MALEATE 20 MG PO TABS: 20.0000 mg | ORAL_TABLET | Freq: Two times a day (BID) | ORAL | 2 refills | Status: DC

## 2020-07-26 NOTE — Patient Instructions (Signed)
DASH Eating Plan DASH stands for "Dietary Approaches to Stop Hypertension." The DASH eating plan is a healthy eating plan that has been shown to reduce high blood pressure (hypertension). It may also reduce your risk for type 2 diabetes, heart disease, and stroke. The DASH eating plan may also help with weight loss. What are tips for following this plan?  General guidelines  Avoid eating more than 2,300 mg (milligrams) of salt (sodium) a day. If you have hypertension, you may need to reduce your sodium intake to 1,500 mg a day.  Limit alcohol intake to no more than 1 drink a day for nonpregnant women and 2 drinks a day for men. One drink equals 12 oz of beer, 5 oz of wine, or 1 oz of hard liquor.  Work with your health care provider to maintain a healthy body weight or to lose weight. Ask what an ideal weight is for you.  Get at least 30 minutes of exercise that causes your heart to beat faster (aerobic exercise) most days of the week. Activities may include walking, swimming, or biking.  Work with your health care provider or diet and nutrition specialist (dietitian) to adjust your eating plan to your individual calorie needs. Reading food labels   Check food labels for the amount of sodium per serving. Choose foods with less than 5 percent of the Daily Value of sodium. Generally, foods with less than 300 mg of sodium per serving fit into this eating plan.  To find whole grains, look for the word "whole" as the first word in the ingredient list. Shopping  Buy products labeled as "low-sodium" or "no salt added."  Buy fresh foods. Avoid canned foods and premade or frozen meals. Cooking  Avoid adding salt when cooking. Use salt-free seasonings or herbs instead of table salt or sea salt. Check with your health care provider or pharmacist before using salt substitutes.  Do not fry foods. Cook foods using healthy methods such as baking, boiling, grilling, and broiling instead.  Cook with  heart-healthy oils, such as olive, canola, soybean, or sunflower oil. Meal planning  Eat a balanced diet that includes: ? 5 or more servings of fruits and vegetables each day. At each meal, try to fill half of your plate with fruits and vegetables. ? Up to 6-8 servings of whole grains each day. ? Less than 6 oz of lean meat, poultry, or fish each day. A 3-oz serving of meat is about the same size as a deck of cards. One egg equals 1 oz. ? 2 servings of low-fat dairy each day. ? A serving of nuts, seeds, or beans 5 times each week. ? Heart-healthy fats. Healthy fats called Omega-3 fatty acids are found in foods such as flaxseeds and coldwater fish, like sardines, salmon, and mackerel.  Limit how much you eat of the following: ? Canned or prepackaged foods. ? Food that is high in trans fat, such as fried foods. ? Food that is high in saturated fat, such as fatty meat. ? Sweets, desserts, sugary drinks, and other foods with added sugar. ? Full-fat dairy products.  Do not salt foods before eating.  Try to eat at least 2 vegetarian meals each week.  Eat more home-cooked food and less restaurant, buffet, and fast food.  When eating at a restaurant, ask that your food be prepared with less salt or no salt, if possible. What foods are recommended? The items listed may not be a complete list. Talk with your dietitian about   what dietary choices are best for you. Grains Whole-grain or whole-wheat bread. Whole-grain or whole-wheat pasta. Brown rice. Oatmeal. Quinoa. Bulgur. Whole-grain and low-sodium cereals. Pita bread. Low-fat, low-sodium crackers. Whole-wheat flour tortillas. Vegetables Fresh or frozen vegetables (raw, steamed, roasted, or grilled). Low-sodium or reduced-sodium tomato and vegetable juice. Low-sodium or reduced-sodium tomato sauce and tomato paste. Low-sodium or reduced-sodium canned vegetables. Fruits All fresh, dried, or frozen fruit. Canned fruit in natural juice (without  added sugar). Meat and other protein foods Skinless chicken or turkey. Ground chicken or turkey. Pork with fat trimmed off. Fish and seafood. Egg whites. Dried beans, peas, or lentils. Unsalted nuts, nut butters, and seeds. Unsalted canned beans. Lean cuts of beef with fat trimmed off. Low-sodium, lean deli meat. Dairy Low-fat (1%) or fat-free (skim) milk. Fat-free, low-fat, or reduced-fat cheeses. Nonfat, low-sodium ricotta or cottage cheese. Low-fat or nonfat yogurt. Low-fat, low-sodium cheese. Fats and oils Soft margarine without trans fats. Vegetable oil. Low-fat, reduced-fat, or light mayonnaise and salad dressings (reduced-sodium). Canola, safflower, olive, soybean, and sunflower oils. Avocado. Seasoning and other foods Herbs. Spices. Seasoning mixes without salt. Unsalted popcorn and pretzels. Fat-free sweets. What foods are not recommended? The items listed may not be a complete list. Talk with your dietitian about what dietary choices are best for you. Grains Baked goods made with fat, such as croissants, muffins, or some breads. Dry pasta or rice meal packs. Vegetables Creamed or fried vegetables. Vegetables in a cheese sauce. Regular canned vegetables (not low-sodium or reduced-sodium). Regular canned tomato sauce and paste (not low-sodium or reduced-sodium). Regular tomato and vegetable juice (not low-sodium or reduced-sodium). Pickles. Olives. Fruits Canned fruit in a light or heavy syrup. Fried fruit. Fruit in cream or butter sauce. Meat and other protein foods Fatty cuts of meat. Ribs. Fried meat. Bacon. Sausage. Bologna and other processed lunch meats. Salami. Fatback. Hotdogs. Bratwurst. Salted nuts and seeds. Canned beans with added salt. Canned or smoked fish. Whole eggs or egg yolks. Chicken or turkey with skin. Dairy Whole or 2% milk, cream, and half-and-half. Whole or full-fat cream cheese. Whole-fat or sweetened yogurt. Full-fat cheese. Nondairy creamers. Whipped toppings.  Processed cheese and cheese spreads. Fats and oils Butter. Stick margarine. Lard. Shortening. Ghee. Bacon fat. Tropical oils, such as coconut, palm kernel, or palm oil. Seasoning and other foods Salted popcorn and pretzels. Onion salt, garlic salt, seasoned salt, table salt, and sea salt. Worcestershire sauce. Tartar sauce. Barbecue sauce. Teriyaki sauce. Soy sauce, including reduced-sodium. Steak sauce. Canned and packaged gravies. Fish sauce. Oyster sauce. Cocktail sauce. Horseradish that you find on the shelf. Ketchup. Mustard. Meat flavorings and tenderizers. Bouillon cubes. Hot sauce and Tabasco sauce. Premade or packaged marinades. Premade or packaged taco seasonings. Relishes. Regular salad dressings. Where to find more information:  National Heart, Lung, and Blood Institute: www.nhlbi.nih.gov  American Heart Association: www.heart.org Summary  The DASH eating plan is a healthy eating plan that has been shown to reduce high blood pressure (hypertension). It may also reduce your risk for type 2 diabetes, heart disease, and stroke.  With the DASH eating plan, you should limit salt (sodium) intake to 2,300 mg a day. If you have hypertension, you may need to reduce your sodium intake to 1,500 mg a day.  When on the DASH eating plan, aim to eat more fresh fruits and vegetables, whole grains, lean proteins, low-fat dairy, and heart-healthy fats.  Work with your health care provider or diet and nutrition specialist (dietitian) to adjust your eating plan to your   individual calorie needs. This information is not intended to replace advice given to you by your health care provider. Make sure you discuss any questions you have with your health care provider. Document Revised: 06/19/2017 Document Reviewed: 06/30/2016 Elsevier Patient Education  2020 Elsevier Inc.  

## 2020-07-26 NOTE — Progress Notes (Signed)
BP 140/88   Pulse 79   Temp 98 F (36.7 C) (Oral)   Ht 5' 10.12" (1.781 m)   Wt 228 lb 3.7 oz (103.5 kg)   SpO2 98%   BMI 32.64 kg/m    Subjective:    Patient ID: Bryan Mccoy, male    DOB: 07/10/1971, 50 y.o.   MRN: [id number]  HPI: Bryan Mccoy is a 50 y.o. male  Chief Complaint  Patient presents with  . Hypertension   HYPERTENSION / HYPERLIPIDEMIA Continues on Enalapril and Rosuvastatin.  Increased Enalapril to 20 MG in December and taking at night -- has been taking 2 Enalapril = 40 MG for the past 3 days.  Rosuvastatin recently started in December, tolerating this well.  Lost 6 pounds since last visit. Satisfied with current treatment? yes Duration of hypertension: chronic BP monitoring frequency: a few times a day BP range: some elevations still present BP medication side effects: no Past BP meds: HCTZ -- due to gout issues stopped this in May Duration of hyperlipidemia: chronic Cholesterol medication side effects: no Cholesterol supplements: none Past cholesterol medications: Crestor Medication compliance: good compliance Aspirin: no Recent stressors: no Recurrent headaches: no Visual changes: no Palpitations: no Dyspnea: no Chest pain: no Lower extremity edema: no Dizzy/lightheaded: no  The 10-year ASCVD risk score Mikey Bussing DC Jr., et al., 2013) is: 6.8%   Values used to calculate the score:     Age: 2 years     Sex: Male     Is Non-Hispanic African American: No     Diabetic: No     Tobacco smoker: No     Systolic Blood Pressure: 465 mmHg     Is BP treated: Yes     HDL Cholesterol: 37 mg/dL     Total Cholesterol: 221 mg/dL  Relevant past medical, surgical, family and social history reviewed and updated as indicated. Interim medical history since our last visit reviewed. Allergies and medications reviewed and updated.  Review of Systems  Constitutional: Negative for activity change, diaphoresis, fatigue and fever.  Respiratory: Negative for  cough, chest tightness, shortness of breath and wheezing.   Cardiovascular: Negative for chest pain, palpitations and leg swelling.  Gastrointestinal: Negative.   Neurological: Negative.   Psychiatric/Behavioral: Negative.     Per HPI unless specifically indicated above     Objective:    BP 140/88   Pulse 79   Temp 98 F (36.7 C) (Oral)   Ht 5' 10.12" (1.781 m)   Wt 228 lb 3.7 oz (103.5 kg)   SpO2 98%   BMI 32.64 kg/m   Wt Readings from Last 3 Encounters:  07/26/20 228 lb 3.7 oz (103.5 kg)  06/26/20 234 lb (106.1 kg)  05/18/20 210 lb (95.3 kg)    Physical Exam Vitals and nursing note reviewed.  Constitutional:      General: He is awake. He is not in acute distress.    Appearance: He is well-developed and well-groomed. He is obese. He is not ill-appearing.  HENT:     Head: Normocephalic and atraumatic.     Right Ear: Hearing normal. No drainage.     Left Ear: Hearing normal. No drainage.  Eyes:     General: Lids are normal.        Right eye: No discharge.        Left eye: No discharge.     Conjunctiva/sclera: Conjunctivae normal.     Pupils: Pupils are equal, round, and reactive to light.  Neck:  Thyroid: No thyromegaly.     Vascular: No carotid bruit.     Trachea: Trachea normal.  Cardiovascular:     Rate and Rhythm: Normal rate and regular rhythm.     Heart sounds: Normal heart sounds, S1 normal and S2 normal. No murmur heard. No gallop.   Pulmonary:     Effort: Pulmonary effort is normal.     Breath sounds: Normal breath sounds.  Abdominal:     General: Bowel sounds are normal.     Palpations: Abdomen is soft. There is no hepatomegaly or splenomegaly.  Musculoskeletal:        General: Normal range of motion.     Cervical back: Normal range of motion and neck supple.     Right lower leg: No edema.     Left lower leg: No edema.  Skin:    General: Skin is warm and dry.     Capillary Refill: Capillary refill takes less than 2 seconds.     Findings: No  rash.  Neurological:     Mental Status: He is alert and oriented to person, place, and time.     Deep Tendon Reflexes: Reflexes are normal and symmetric.  Psychiatric:        Attention and Perception: Attention normal.        Mood and Affect: Mood normal.        Speech: Speech normal.        Behavior: Behavior normal. Behavior is cooperative.        Thought Content: Thought content normal.     Results for orders placed or performed in visit on 06/26/20  Uric acid  Result Value Ref Range   Uric Acid 7.5 3.8 - 8.4 mg/dL  Lipid panel  Result Value Ref Range   Cholesterol, Total 221 (H) 100 - 199 mg/dL   Triglycerides 276 (H) 0 - 149 mg/dL   HDL 37 (L) >39 mg/dL   VLDL Cholesterol Cal 50 (H) 5 - 40 mg/dL   LDL Chol Calc (NIH) 134 (H) 0 - 99 mg/dL   Chol/HDL Ratio 6.0 (H) 0.0 - 5.0 ratio  Basic Metabolic Panel (BMET)  Result Value Ref Range   Glucose 94 65 - 99 mg/dL   BUN 14 6 - 24 mg/dL   Creatinine, Ser 0.90 0.76 - 1.27 mg/dL   GFR calc non Af Amer 100 >59 mL/min/1.73   GFR calc Af Amer 116 >59 mL/min/1.73   BUN/Creatinine Ratio 16 9 - 20   Sodium 140 134 - 144 mmol/L   Potassium 3.9 3.5 - 5.2 mmol/L   Chloride 104 96 - 106 mmol/L   CO2 22 20 - 29 mmol/L   Calcium 9.0 8.7 - 10.2 mg/dL  Hepatitis C antibody  Result Value Ref Range   Hep C Virus Ab <0.1 0.0 - 0.9 s/co ratio  HIV antibody (with reflex)  Result Value Ref Range   HIV Screen 4th Generation wRfx Non Reactive Non Reactive      Assessment & Plan:   Problem List Items Addressed This Visit      Cardiovascular and Mediastinum   Essential hypertension - Primary    Chronic, ongoing with BP remaining above goal on recheck today.  He just self- increased his Enalapril to 40 MG daily, recommend he split this to 20 MG BID.  Will bring him back in 6 weeks to recheck and see if addition of medication needed, consider Amlodipine.  He has been losing weight and focused on DASH diet.  Recommend she monitor BP at least a  few mornings a week at home and document.  DASH diet at home.  Labs today.  Return in 6 weeks.       Relevant Medications   enalapril (VASOTEC) 20 MG tablet   Other Relevant Orders   TSH   Basic metabolic panel     Other   Elevated LDL cholesterol level    Ongoing, started on Crestor recent visit.  Recheck lipid panel today and continue current regimen.      Relevant Orders   Lipid Panel w/o Chol/HDL Ratio   Obesity    Recommended eating smaller high protein, low fat meals more frequently and exercising 30 mins a day 5 times a week with a goal of 10-15lb weight loss in the next 3 months. Patient voiced their understanding and motivation to adhere to these recommendations.        Other Visit Diagnoses    Colon cancer screening       Cologuard ordered   Relevant Orders   Cologuard       Follow up plan: Return in about 6 months (around 01/23/2021) for HTN/HLD.

## 2020-07-26 NOTE — Assessment & Plan Note (Signed)
Chronic, ongoing with BP remaining above goal on recheck today.  He just self- increased his Enalapril to 40 MG daily, recommend he split this to 20 MG BID.  Will bring him back in 6 weeks to recheck and see if addition of medication needed, consider Amlodipine.  He has been losing weight and focused on DASH diet.  Recommend she monitor BP at least a few mornings a week at home and document.  DASH diet at home.  Labs today.  Return in 6 weeks.

## 2020-07-26 NOTE — Assessment & Plan Note (Signed)
Ongoing, started on Crestor recent visit.  Recheck lipid panel today and continue current regimen.

## 2020-07-26 NOTE — Assessment & Plan Note (Signed)
Recommended eating smaller high protein, low fat meals more frequently and exercising 30 mins a day 5 times a week with a goal of 10-15lb weight loss in the next 3 months. Patient voiced their understanding and motivation to adhere to these recommendations.  

## 2020-07-27 LAB — BASIC METABOLIC PANEL
BUN/Creatinine Ratio: 10 (ref 9–20)
BUN: 11 mg/dL (ref 6–24)
CO2: 22 mmol/L (ref 20–29)
Calcium: 8.9 mg/dL (ref 8.7–10.2)
Chloride: 100 mmol/L (ref 96–106)
Creatinine, Ser: 1.06 mg/dL (ref 0.76–1.27)
GFR calc Af Amer: 95 mL/min/{1.73_m2} (ref >59)
GFR calc non Af Amer: 82 mL/min/{1.73_m2} (ref >59)
Glucose: 116 mg/dL — ABNORMAL HIGH (ref 65–99)
Potassium: 4.3 mmol/L (ref 3.5–5.2)
Sodium: 139 mmol/L (ref 134–144)

## 2020-07-27 LAB — LIPID PANEL W/O CHOL/HDL RATIO
Cholesterol, Total: 167 mg/dL (ref 100–199)
HDL: 40 mg/dL (ref >39)
LDL Chol Calc (NIH): 102 mg/dL — ABNORMAL HIGH (ref 0–99)
Triglycerides: 143 mg/dL (ref 0–149)
VLDL Cholesterol Cal: 25 mg/dL (ref 5–40)

## 2020-07-27 LAB — TSH: TSH: 1.35 u[IU]/mL (ref 0.450–4.500)

## 2020-07-27 NOTE — Progress Notes (Signed)
Contacted via [place] afternoon Waunita Schooner, your labs have returned.  Kidney function and thyroid normal.  Glucose, sugar, was a little elevated.  Remind me, did you eat before visit?  Cholesterol levels are coming down with Crestor, but LDL remains above goal.  Your level is 102, would like to be less than 70 for stroke prevention.  I would recommend increasing Crestor to 10 MG if you are tolerating this medication.  Would you like to try this?  Let me know.  Any questions? Keep being awesome!!  Thank you for allowing me to participate in your care. Kindest regards, Chayton Murata

## 2020-08-02 LAB — COLOGUARD: Cologuard: POSITIVE — AB

## 2020-08-14 ENCOUNTER — Telehealth: Payer: Self-pay | Admitting: Nurse Practitioner

## 2020-08-14 ENCOUNTER — Other Ambulatory Visit: Payer: Self-pay | Admitting: Nurse Practitioner

## 2020-08-14 DIAGNOSIS — R195 Other fecal abnormalities: Secondary | ICD-10-CM

## 2020-08-14 NOTE — Progress Notes (Signed)
Error

## 2020-08-14 NOTE — Telephone Encounter (Signed)
Spoke to patient on telephone and alerted him of return of positive Cologuard screening.  At this time all questions answered and will place referral to GI for colonoscopy to further assess.

## 2020-08-17 ENCOUNTER — Other Ambulatory Visit: Payer: Self-pay

## 2020-08-17 ENCOUNTER — Telehealth (INDEPENDENT_AMBULATORY_CARE_PROVIDER_SITE_OTHER): Payer: Self-pay | Admitting: Gastroenterology

## 2020-08-17 DIAGNOSIS — R195 Other fecal abnormalities: Secondary | ICD-10-CM

## 2020-08-17 MED ORDER — NA SULFATE-K SULFATE-MG SULF 17.5-3.13-1.6 GM/177ML PO SOLN: 1.0000 | Freq: Once | ORAL | 0 refills | Status: AC

## 2020-08-17 NOTE — Progress Notes (Signed)
Gastroenterology Pre-Procedure Review  Request Date: Wed 08/22/20 Requesting Physician: Dr. Bonna Gains  PATIENT REVIEW QUESTIONS: The patient responded to the following health history questions as indicated:    1. Are you having any GI issues? no 2. Do you have a personal history of Polyps? no 3. Do you have a family history of Colon Cancer or Polyps? no 4. Diabetes Mellitus? no 5. Joint replacements in the past 12 months?no 6. Major health problems in the past 3 months?no 7. Any artificial heart valves, MVP, or defibrillator?no    MEDICATIONS & ALLERGIES:    Patient reports the following regarding taking any anticoagulation/antiplatelet therapy:   Plavix, Coumadin, Eliquis, Xarelto, Lovenox, Pradaxa, Brilinta, or Effient? no Aspirin? yes (81 mg daily)  Patient confirms/reports the following medications:  Current Outpatient Medications  Medication Sig Dispense Refill  . aspirin (BAYER LOW DOSE) 81 MG EC tablet Take 81 mg by mouth daily. Swallow whole.    . enalapril (VASOTEC) 20 MG tablet Take 1 tablet (20 mg total) by mouth 2 (two) times daily. 180 tablet 2  . Na Sulfate-K Sulfate-Mg Sulf 17.5-3.13-1.6 GM/177ML SOLN Take 1 kit by mouth once for 1 dose. 354 mL 0  . rosuvastatin (CRESTOR) 5 MG tablet Take 1 tablet (5 mg total) by mouth daily. 90 tablet 3   No current facility-administered medications for this visit.    Patient confirms/reports the following allergies:  Allergies  Allergen Reactions  . Milk-Related Compounds   . Peanut-Containing Drug Products     No orders of the defined types were placed in this encounter.   AUTHORIZATION INFORMATION Primary Insurance: 1D#: Group #:  Secondary Insurance: 1D#: Group #:  SCHEDULE INFORMATION: Date: 08/22/20 Time: Location:[facility]

## 2020-08-20 ENCOUNTER — Other Ambulatory Visit
Admission: RE | Admit: 2020-08-20 | Discharge: 2020-08-20 | Disposition: A | Discharge status: Home / Self Care | Payer: Managed Care, Other (non HMO) | Source: Ambulatory Visit | Attending: Gastroenterology | Admitting: Gastroenterology

## 2020-08-20 ENCOUNTER — Other Ambulatory Visit: Payer: Self-pay

## 2020-08-20 DIAGNOSIS — Z20822 Contact with and (suspected) exposure to covid-19: Secondary | ICD-10-CM | POA: Insufficient documentation

## 2020-08-20 DIAGNOSIS — Z01812 Encounter for preprocedural laboratory examination: Secondary | ICD-10-CM | POA: Insufficient documentation

## 2020-08-20 LAB — SARS CORONAVIRUS 2 (TAT 6-24 HRS): SARS Coronavirus 2: NEGATIVE

## 2020-08-20 MED ORDER — PEG 3350-KCL-NA BICARB-NACL 420 G PO SOLR: ORAL | 0 refills | Status: DC

## 2020-08-21 ENCOUNTER — Encounter: Payer: Self-pay | Admitting: Gastroenterology

## 2020-08-22 ENCOUNTER — Encounter
Admission: RE | Disposition: A | Discharge status: Home / Self Care | Payer: Self-pay | Source: Home / Self Care | Attending: Gastroenterology

## 2020-08-22 ENCOUNTER — Ambulatory Visit
Admission: RE | Admit: 2020-08-22 | Discharge: 2020-08-22 | Disposition: A | Discharge status: Home / Self Care | Payer: Managed Care, Other (non HMO) | Attending: Gastroenterology | Admitting: Gastroenterology

## 2020-08-22 ENCOUNTER — Ambulatory Visit: Payer: Managed Care, Other (non HMO) | Admitting: Anesthesiology

## 2020-08-22 ENCOUNTER — Other Ambulatory Visit: Payer: Self-pay

## 2020-08-22 ENCOUNTER — Encounter: Payer: Self-pay | Admitting: Gastroenterology

## 2020-08-22 DIAGNOSIS — R195 Other fecal abnormalities: Secondary | ICD-10-CM

## 2020-08-22 DIAGNOSIS — Z8249 Family history of ischemic heart disease and other diseases of the circulatory system: Secondary | ICD-10-CM | POA: Insufficient documentation

## 2020-08-22 DIAGNOSIS — Z7982 Long term (current) use of aspirin: Secondary | ICD-10-CM | POA: Diagnosis not present

## 2020-08-22 DIAGNOSIS — Z87891 Personal history of nicotine dependence: Secondary | ICD-10-CM | POA: Insufficient documentation

## 2020-08-22 DIAGNOSIS — Z9049 Acquired absence of other specified parts of digestive tract: Secondary | ICD-10-CM | POA: Insufficient documentation

## 2020-08-22 DIAGNOSIS — K635 Polyp of colon: Secondary | ICD-10-CM | POA: Diagnosis not present

## 2020-08-22 DIAGNOSIS — I10 Essential (primary) hypertension: Secondary | ICD-10-CM | POA: Diagnosis not present

## 2020-08-22 DIAGNOSIS — K5730 Diverticulosis of large intestine without perforation or abscess without bleeding: Secondary | ICD-10-CM | POA: Insufficient documentation

## 2020-08-22 DIAGNOSIS — E7800 Pure hypercholesterolemia, unspecified: Secondary | ICD-10-CM | POA: Insufficient documentation

## 2020-08-22 HISTORY — PX: COLONOSCOPY WITH PROPOFOL: SHX5780

## 2020-08-22 HISTORY — DX: Pure hypercholesterolemia, unspecified: E78.00

## 2020-08-22 SURGERY — COLONOSCOPY WITH PROPOFOL
Anesthesia: General

## 2020-08-22 MED ORDER — LIDOCAINE HCL (CARDIAC) PF 100 MG/5ML IV SOSY
PREFILLED_SYRINGE | INTRAVENOUS | Status: DC | PRN
  Administered 2020-08-22: 50 mg via INTRAVENOUS

## 2020-08-22 MED ORDER — PROPOFOL 500 MG/50ML IV EMUL
INTRAVENOUS | Status: AC
  Filled 2020-08-22: qty 50

## 2020-08-22 MED ORDER — DEXMEDETOMIDINE (PRECEDEX) IN NS 20 MCG/5ML (4 MCG/ML) IV SYRINGE
PREFILLED_SYRINGE | INTRAVENOUS | Status: DC | PRN
  Administered 2020-08-22 (×2): 8 ug via INTRAVENOUS

## 2020-08-22 MED ORDER — SODIUM CHLORIDE 0.9 % IV SOLN: INTRAVENOUS | Status: DC

## 2020-08-22 MED ORDER — PROPOFOL 10 MG/ML IV BOLUS
INTRAVENOUS | Status: AC
  Filled 2020-08-22: qty 20

## 2020-08-22 MED ORDER — PROPOFOL 10 MG/ML IV BOLUS
INTRAVENOUS | Status: DC | PRN
  Administered 2020-08-22: 30 mg via INTRAVENOUS
  Administered 2020-08-22: 70 mg via INTRAVENOUS

## 2020-08-22 MED ORDER — PROPOFOL 500 MG/50ML IV EMUL
INTRAVENOUS | Status: DC | PRN
  Administered 2020-08-22: 150 ug/kg/min via INTRAVENOUS

## 2020-08-22 NOTE — Anesthesia Procedure Notes (Signed)
Procedure Name: MAC Date/Time: 08/22/2020 10:25 AM Performed by: Jerrye Noble, CRNA Pre-anesthesia Checklist: Patient identified, Emergency Drugs available, Suction available and Patient being monitored Patient Re-evaluated:Patient Re-evaluated prior to induction Oxygen Delivery Method: Nasal cannula

## 2020-08-22 NOTE — H&P (Signed)
Vonda Antigua, MD [address], [place], [place], [place], [address] [id number], [place], [place], [place], [address] Phone: [phone]  Fax: [phone]  Primary Care Physician:  Venita Lick, NP   Pre-Procedure History & Physical: HPI:  Bryan Mccoy is a 50 y.o. male is here for a colonoscopy.   Past Medical History:  Diagnosis Date  . Allergy   . Anxiety   . Depression   . Hypercholesteremia   . Hypertension     Past Surgical History:  Procedure Laterality Date  . ANKLE FRACTURE SURGERY     right  . APPENDECTOMY    . FRACTURE SURGERY      Prior to Admission medications   Medication Sig Start Date End Date Taking? Authorizing Provider  aspirin 81 MG EC tablet Take 81 mg by mouth daily. Swallow whole.   Yes [provider]  enalapril (VASOTEC) 20 MG tablet Take 1 tablet (20 mg total) by mouth 2 (two) times daily. 07/26/20  Yes Cannady, Jolene T, NP  rosuvastatin (CRESTOR) 5 MG tablet Take 1 tablet (5 mg total) by mouth daily. 06/27/20  Yes Myles Gip, DO  polyethylene glycol-electrolytes (NULYTELY) 420 g solution Prepare according to package instructions. Starting at 5:00 PM: Drink one 8 oz glass of mixture every 15 minutes until you finish half of the jug. Five hours prior to procedure, drink 8 oz glass of mixture every 15 minutes until it is all gone. Make sure you do not drink anything 4 hours prior to your procedure. 08/20/20   Virgel Manifold, MD    Allergies as of 08/17/2020 - Review Complete 08/17/2020  Allergen Reaction Noted  . Milk-related compounds    . Peanut-containing drug products      Family History  Problem Relation Age of Onset  . Hyperlipidemia Mother   . Hypertension Mother   . Migraines Sister   . Hyperlipidemia Brother   . Allergies Son   . Lung cancer Maternal Grandfather   . Diabetes Paternal Grandmother   . Alzheimer's disease Paternal Grandfather     Social History   Socioeconomic History  .  Marital status: Legally Separated    Spouse name: Not on file  . Number of children: 2  . Years of education: Not on file  . Highest education level: Not on file  Occupational History  . Not on file  Tobacco Use  . Smoking status: Former Smoker    Packs/day: 1.50    Years: 10.00    Pack years: 15.00    Types: Cigarettes    Quit date: 2010    Years since quitting: 12.0  . Smokeless tobacco: Never Used  Vaping Use  . Vaping Use: Never used  Substance and Sexual Activity  . Alcohol use: Yes    Comment: very rare  . Drug use: Never  . Sexual activity: Yes  Other Topics Concern  . Not on file  Social History Narrative  . Not on file   Social Determinants of Health   Financial Resource Strain: Low Risk   . Difficulty of Paying Living Expenses: Not hard at all  Food Insecurity: No Food Insecurity  . Worried About Charity fundraiser in the Last Year: Never true  . Ran Out of Food in the Last Year: Never true  Transportation Needs: No Transportation Needs  . Lack of Transportation (Medical): No  . Lack of Transportation (Non-Medical): No  Physical Activity: Insufficiently Active  . Days of Exercise per Week:  3 days  . Minutes of Exercise per Session: 30 min  Stress: Stress Concern Present  . Feeling of Stress : Rather much  Social Connections: Moderately Integrated  . Frequency of Communication with Friends and Family: Three times a week  . Frequency of Social Gatherings with Friends and Family: Three times a week  . Attends Religious Services: More than 4 times per year  . Active Member of Clubs or Organizations: No  . Attends Archivist Meetings: Never  . Marital Status: Married  Human resources officer Violence: Not on file    Review of Systems: See HPI, otherwise negative ROS  Physical Exam: BP (!) 172/103   Pulse 75   Temp (!) 97.2 F (36.2 C) (Skin)   Resp 18   Ht 5' 11"  (1.803 m)   Wt 104.3 kg   SpO2 98%   BMI 32.08 kg/m  General:   Alert,   pleasant and cooperative in NAD Head:  Normocephalic and atraumatic. Neck:  Supple; no masses or thyromegaly. Lungs:  Clear throughout to auscultation, normal respiratory effort.    Heart:  +S1, +S2, Regular rate and rhythm, No edema. Abdomen:  Soft, nontender and nondistended. Normal bowel sounds, without guarding, and without rebound.   Neurologic:  Alert and  oriented x4;  grossly normal neurologically.  Impression/Plan: Bryan Mccoy is here for a colonoscopy to be performed for positive cologuard  Risks, benefits, limitations, and alternatives regarding  colonoscopy have been reviewed with the patient.  Questions have been answered.  All parties agreeable.   Virgel Manifold, MD  08/22/2020, 9:57 AM

## 2020-08-22 NOTE — Op Note (Signed)
[facility] Patient Name: Bryan Mccoy Procedure Date: 08/22/2020 10:19 AM MRN: [id number] Account #: [id number] Date of Birth: 11/05/1970 Admit Type: Outpatient Age: 50 Room: [facility] ENDO ROOM 3 Gender: Male Note Status: Finalized Procedure:             Colonoscopy Indications:           Positive Cologuard test Providers:             Josiephine Simao B. Bonna Gains MD, MD Referring MD:          Marnee Guarneri, NP Medicines:             Monitored Anesthesia Care Complications:         No immediate complications. Procedure:             Pre-Anesthesia Assessment:                        - ASA Grade Assessment: II - A patient with mild                         systemic disease.                        - Prior to the procedure, a History and Physical was                         performed, and patient medications, allergies and                         sensitivities were reviewed. The patient's tolerance                         of previous anesthesia was reviewed.                        - The risks and benefits of the procedure and the                         sedation options and risks were discussed with the                         patient. All questions were answered and informed                         consent was obtained.                        - Patient identification and proposed procedure were                         verified prior to the procedure by the physician, the                         nurse, the anesthesiologist, the anesthetist and the                         technician. The procedure was verified in the                         procedure room.  After obtaining informed consent, the colonoscope was                         passed under direct vision. Throughout the procedure,                         the patient's blood pressure, pulse, and oxygen                         saturations were monitored continuously. The                          Colonoscope was introduced through the anus and                         advanced to the the cecum, identified by appendiceal                         orifice and ileocecal valve. The colonoscopy was                         performed with ease. The patient tolerated the                         procedure well. The quality of the bowel preparation                         was fair in the right colon, but the colon was cleaned                         and the prep was changed to a good prep with water and                         suctioning. Findings:      The perianal and digital rectal examinations were normal.      Two flat polyps were found in the sigmoid colon. The polyps were 6 to 7       mm in size. These polyps were removed with a cold snare. Resection and       retrieval were complete.      Multiple diverticula were found in the sigmoid colon.      The exam was otherwise without abnormality.      The rectum, sigmoid colon, descending colon, transverse colon, ascending       colon and cecum appeared normal.      The retroflexed view of the distal rectum and anal verge was normal and       showed no anal or rectal abnormalities. Impression:            - Preparation of the colon was fair.                        - Two 6 to 7 mm polyps in the sigmoid colon, removed                         with a cold snare. Resected and retrieved.                        -  Diverticulosis in the sigmoid colon.                        - The examination was otherwise normal.                        - The rectum, sigmoid colon, descending colon,                         transverse colon, ascending colon and cecum are normal.                        - The distal rectum and anal verge are normal on                         retroflexion view. Recommendation:        - Discharge patient to home (with escort).                        - High fiber diet.                        - Advance diet as tolerated.                         - Continue present medications.                        - Await pathology results.                        - Repeat colonoscopy date to be determined after                         pending pathology results are reviewed.                        - The findings and recommendations were discussed with                         the patient.                        - The findings and recommendations were discussed with                         the patient's family.                        - Return to primary care physician as previously                         scheduled. Procedure Code(s):     --- Professional ---                        [id number], Colonoscopy, flexible; with removal of                         tumor(s), polyp(s), or other lesion(s) by snare  technique Diagnosis Code(s):     --- Professional ---                        K63.5, Polyp of colon                        R19.5, Other fecal abnormalities CPT copyright 2019 American Medical Association. All rights reserved. The codes documented in this report are preliminary and upon coder review may  be revised to meet current compliance requirements.  Vonda Antigua, MD Margretta Sidle B. Bonna Gains MD, MD 08/22/2020 11:15:20 AM This report has been signed electronically. Number of Addenda: 0 Note Initiated On: 08/22/2020 10:19 AM Scope Withdrawal Time: 0 hours 25 minutes 54 seconds  Total Procedure Duration: 0 hours 33 minutes 36 seconds  Estimated Blood Loss:  Estimated blood loss: none.      [facility]

## 2020-08-22 NOTE — Anesthesia Preprocedure Evaluation (Addendum)
Anesthesia Evaluation  Patient identified by MRN, date of birth, ID band Patient awake    Reviewed: Allergy & Precautions, H&P , NPO status , Patient's Chart, lab work & pertinent test results  Airway Mallampati: II       Dental no notable dental hx.    Pulmonary neg pulmonary ROS, former smoker,    Pulmonary exam normal breath sounds clear to auscultation       Cardiovascular hypertension, negative cardio ROS Normal cardiovascular exam Rhythm:Regular Rate:Normal     Neuro/Psych PSYCHIATRIC DISORDERS Anxiety Depression negative neurological ROS     GI/Hepatic negative GI ROS, Neg liver ROS,   Endo/Other  negative endocrine ROS  Renal/GU negative Renal ROS  negative genitourinary   Musculoskeletal negative musculoskeletal ROS (+)   Abdominal   Peds negative pediatric ROS (+)  Hematology negative hematology ROS (+)   Anesthesia Other Findings Past Medical History: No date: Allergy No date: Anxiety No date: Depression No date: Hypercholesteremia No date: Hypertension   Reproductive/Obstetrics negative OB ROS                           Anesthesia Physical Anesthesia Plan  ASA: II  Anesthesia Plan: General   Post-op Pain Management:    Induction: Intravenous  PONV Risk Score and Plan: 2 and Propofol infusion  Airway Management Planned: Natural Airway and Nasal Cannula  Additional Equipment:   Intra-op Plan:   Post-operative Plan:   Informed Consent: I have reviewed the patients History and Physical, chart, labs and discussed the procedure including the risks, benefits and alternatives for the proposed anesthesia with the patient or authorized representative who has indicated his/her understanding and acceptance.       Plan Discussed with: CRNA, Anesthesiologist and Surgeon  Anesthesia Plan Comments:         Anesthesia Quick Evaluation

## 2020-08-22 NOTE — Transfer of Care (Signed)
Immediate Anesthesia Transfer of Care Note  Patient: Bryan Mccoy  Procedure(s) Performed: COLONOSCOPY WITH PROPOFOL (N/A )  Patient Location: PACU and Endoscopy Unit  Anesthesia Type:General  Level of Consciousness: drowsy and patient cooperative  Airway & Oxygen Therapy: Patient Spontanous Breathing  Post-op Assessment: Report given to RN and Post -op Vital signs reviewed and stable  Post vital signs: Reviewed and stable  Last Vitals:  Vitals Value Taken Time  BP 121/80   Temp    Pulse 82 08/22/20 1108  Resp 10 08/22/20 1108  SpO2 97 % 08/22/20 1108  Vitals shown include unvalidated device data.  Last Pain:  Vitals:   08/22/20 0926  TempSrc: Skin  PainSc: 0-No pain         Complications: No complications documented.

## 2020-08-22 NOTE — Anesthesia Postprocedure Evaluation (Signed)
Anesthesia Post Note  Patient: Bryan Mccoy  Procedure(s) Performed: COLONOSCOPY WITH PROPOFOL (N/A )  Patient location during evaluation: Endoscopy Anesthesia Type: General Level of consciousness: awake Pain management: pain level controlled Vital Signs Assessment: post-procedure vital signs reviewed and stable Respiratory status: spontaneous breathing Cardiovascular status: blood pressure returned to baseline Postop Assessment: no apparent nausea or vomiting Anesthetic complications: no   No complications documented.   Last Vitals:  Vitals:   08/22/20 1107 08/22/20 1116  BP: 121/80 133/89  Pulse: 83 81  Resp: 11 14  Temp: (!) 36.1 C   SpO2: 97% 97%    Last Pain:  Vitals:   08/22/20 1116  TempSrc:   PainSc: 0-No pain                 Neva Seat

## 2020-08-23 ENCOUNTER — Encounter: Payer: Self-pay | Admitting: Gastroenterology

## 2020-08-23 LAB — SURGICAL PATHOLOGY

## 2020-08-24 ENCOUNTER — Encounter: Payer: Self-pay | Admitting: Gastroenterology

## 2020-08-30 ENCOUNTER — Other Ambulatory Visit: Payer: Self-pay | Admitting: Nurse Practitioner

## 2020-08-30 MED ORDER — ENALAPRIL MALEATE 20 MG PO TABS: 20.0000 mg | ORAL_TABLET | Freq: Two times a day (BID) | ORAL | 4 refills | Status: DC

## 2020-08-30 MED ORDER — ROSUVASTATIN CALCIUM 10 MG PO TABS: 10.0000 mg | ORAL_TABLET | Freq: Every day | ORAL | 3 refills | Status: DC

## 2021-01-23 ENCOUNTER — Ambulatory Visit: Payer: 59 | Admitting: Nurse Practitioner

## 2021-10-15 ENCOUNTER — Telehealth: Payer: 59 | Admitting: Family Medicine

## 2021-10-15 DIAGNOSIS — M109 Gout, unspecified: Secondary | ICD-10-CM | POA: Diagnosis not present

## 2021-10-15 MED ORDER — COLCHICINE 0.6 MG PO TABS: ORAL_TABLET | ORAL | 0 refills | Status: DC

## 2021-10-15 MED ORDER — PREDNISONE 20 MG PO TABS: 40.0000 mg | ORAL_TABLET | Freq: Every day | ORAL | 0 refills | Status: AC

## 2021-10-15 MED ORDER — IBUPROFEN 600 MG PO TABS: 600.0000 mg | ORAL_TABLET | Freq: Three times a day (TID) | ORAL | 0 refills | Status: AC | PRN

## 2021-10-15 NOTE — Patient Instructions (Signed)
Gout ?Gout is a condition that causes painful swelling of the joints. Gout is a type of inflammation of the joints (arthritis). This condition is caused by having too much uric acid in the body. Uric acid is a chemical that forms when the body breaks down substances called purines. Purines are important for building body proteins. ?When the body has too much uric acid, sharp crystals can form and build up inside the joints. This causes pain and swelling. Gout attacks can happen quickly and may be very painful (acute gout). Over time, the attacks can affect more joints and become more frequent (chronic gout). Gout can also cause uric acid to build up under the skin and inside the kidneys. ?What are the causes? ?This condition is caused by too much uric acid in your blood. This can happen because: ?Your kidneys do not remove enough uric acid from your blood. This is the most common cause. ?Your body makes too much uric acid. This can happen with some cancers and cancer treatments. It can also occur if your body is breaking down too many red blood cells (hemolytic anemia). ?You eat too many foods that are high in purines. These foods include organ meats and some seafood. Alcohol, especially beer, is also high in purines. ?A gout attack may be triggered by trauma or stress. ?What increases the risk? ?You are more likely to develop this condition if you: ?Have a family history of gout. ?Are male and middle-aged. ?Are male and have gone through menopause. ?Are obese. ?Frequently drink alcohol, especially beer. ?Are dehydrated. ?Lose weight too quickly. ?Have an organ transplant. ?Have lead poisoning. ?Take certain medicines, including aspirin, cyclosporine, diuretics, levodopa, and niacin. ?Have kidney disease. ?Have a skin condition called psoriasis. ?What are the signs or symptoms? ?An attack of acute gout happens quickly. It usually occurs in just one joint. The most common place is the big toe. Attacks often start  at night. Other joints that may be affected include joints of the feet, ankle, knee, fingers, wrist, or elbow. Symptoms of this condition may include: ?Severe pain. ?Warmth. ?Swelling. ?Stiffness. ?Tenderness. The affected joint may be very painful to touch. ?Shiny, red, or purple skin. ?Chills and fever. ?Chronic gout may cause symptoms more frequently. More joints may be involved. You may also have white or yellow lumps (tophi) on your hands or feet or in other areas near your joints. ?How is this diagnosed? ?This condition is diagnosed based on your symptoms, medical history, and physical exam. You may have tests, such as: ?Blood tests to measure uric acid levels. ?Removal of joint fluid with a thin needle (aspiration) to look for uric acid crystals. ?X-rays to look for joint damage. ?How is this treated? ?Treatment for this condition has two phases: treating an acute attack and preventing future attacks. Acute gout treatment may include medicines to reduce pain and swelling, including: ?NSAIDs. ?Steroids. These are strong anti-inflammatory medicines that can be taken by mouth (orally) or injected into a joint. ?Colchicine. This medicine relieves pain and swelling when it is taken soon after an attack. It can be given by mouth or through an IV. ?Preventive treatment may include: ?Daily use of smaller doses of NSAIDs or colchicine. ?Use of a medicine that reduces uric acid levels in your blood. ?Changes to your diet. You may need to see a dietitian about what to eat and drink to prevent gout. ?Follow these instructions at home: ?During a gout attack ? ?If directed, put ice on the affected area: ?  Put ice in a plastic bag. ?Place a towel between your skin and the bag. ?Leave the ice on for 20 minutes, 2-3 times a day. ?Raise (elevate) the affected joint above the level of your heart as often as possible. ?Rest the joint as much as possible. If the affected joint is in your leg, you may be given crutches to  use. ?Follow instructions from your health care provider about eating or drinking restrictions. ?Avoiding future gout attacks ?Follow a low-purine diet as told by your dietitian or health care provider. Avoid foods and drinks that are high in purines, including liver, kidney, anchovies, asparagus, herring, mushrooms, mussels, and beer. ?Maintain a healthy weight or lose weight if you are overweight. If you want to lose weight, talk with your health care provider. It is important that you do not lose weight too quickly. ?Start or maintain an exercise program as told by your health care provider. ?Eating and drinking ?Drink enough fluids to keep your urine pale yellow. ?If you drink alcohol: ?Limit how much you use to: ?0-1 drink a day for women. ?0-2 drinks a day for men. ?Be aware of how much alcohol is in your drink. In the [place] one drink equals one 12 oz bottle of beer (355 mL) one 5 oz glass of wine (148 mL), or one 1? oz glass of hard liquor (44 mL). ?General instructions ?Take over-the-counter and prescription medicines only as told by your health care provider. ?Do not drive or use heavy machinery while taking prescription pain medicine. ?Return to your normal activities as told by your health care provider. Ask your health care provider what activities are safe for you. ?Keep all follow-up visits as told by your health care provider. This is important. ?Contact a health care provider if you have: ?Another gout attack. ?Continuing symptoms of a gout attack after 10 days of treatment. ?Side effects from your medicines. ?Chills or a fever. ?Burning pain when you urinate. ?Pain in your lower back or belly. ?Get help right away if you: ?Have severe or uncontrolled pain. ?Cannot urinate. ?Summary ?Gout is painful swelling of the joints caused by inflammation. ?The most common site of pain is the big toe, but it can affect other joints in the body. ?Medicines and dietary changes can help to prevent and treat gout  attacks. ?This information is not intended to replace advice given to you by your health care provider. Make sure you discuss any questions you have with your health care provider. ?Document Revised: 01/15/2018 Document Reviewed: 01/27/2018 ?Elsevier Patient Education ? [place]. ? ?

## 2021-10-15 NOTE — Progress Notes (Signed)
?Virtual Visit Consent  ? ?Bryan Mccoy, you are scheduled for a virtual visit with a [facility] provider today.   ?  ?Just as with appointments in the office, your consent must be obtained to participate.  Your consent will be active for this visit and any virtual visit you may have with one of our providers in the next 365 days.   ?  ?If you have a MyChart account, a copy of this consent can be sent to you electronically.  All virtual visits are billed to your insurance company just like a traditional visit in the office.   ? ?As this is a virtual visit, video technology does not allow for your provider to perform a traditional examination.  This may limit your provider's ability to fully assess your condition.  If your provider identifies any concerns that need to be evaluated in person or the need to arrange testing (such as labs, EKG, etc.), we will make arrangements to do so.   ?  ?Although advances in technology are sophisticated, we cannot ensure that it will always work on either your end or our end.  If the connection with a video visit is poor, the visit may have to be switched to a telephone visit.  With either a video or telephone visit, we are not always able to ensure that we have a secure connection.    ? ?I need to obtain your verbal consent now.   Are you willing to proceed with your visit today?  ?  ?Bryan Mccoy has provided verbal consent on 10/15/2021 for a virtual visit (video or telephone). ?  ?Bryan Mayo, NP  ? ?Date: 10/15/2021 8:18 AM ? ? ?Virtual Visit via Video Note  ? ?Bryan Mccoy, connected with  Bryan Mccoy  ([id number], 03/02/1971) on 10/15/21 at  8:15 AM EDT by a video-enabled telemedicine application and verified that I am speaking with the correct person using two identifiers. ? ?Location: ?Patient: Virtual Visit Location Patient: Home ?Provider: Virtual Visit Location Provider: Home Office ?  ?I discussed the limitations of evaluation and management by  telemedicine and the availability of in person appointments. The patient expressed understanding and agreed to proceed.   ? ?History of Present Illness: ?Bryan Mccoy is a 51 y.o. who identifies as a male who was assigned male at birth, and is being seen today for gout. Has had issues on an off. Says he usually works through it without being seen, by taking motrin. Reports food being main cause he thinks- has lamb last week prior to episode starting. Pain this time started last Wednesday and Thursday of last week. Started in Left great toe, now having pain in arch of foot and up to knee. Hurts to put socks and shoes on. Having trouble walking. Drinking lots of water. Trying to lose weight and eat better. Has not been put on anything for daily prevention at this time. ? ?Problems:  ?Patient Active Problem List  ? Diagnosis Date Noted  ? Positive colorectal cancer screening using Cologuard test   ? Polyp of colon   ? Obesity 07/26/2020  ? Gout 06/26/2020  ? Elevated LDL cholesterol level 12/25/2019  ? Essential hypertension 12/01/2019  ? Left knee pain 12/01/2019  ?  ?Allergies:  ?Allergies  ?Allergen Reactions  ? Milk-Related Compounds   ? Peanut-Containing Drug Products   ? ?Medications:  ?Current Outpatient Medications:  ?  aspirin 81 MG EC tablet, Take 81 mg by mouth  daily. Swallow whole., Disp: , Rfl:  ?  enalapril (VASOTEC) 20 MG tablet, Take 1 tablet (20 mg total) by mouth 2 (two) times daily., Disp: 180 tablet, Rfl: 4 ?  polyethylene glycol-electrolytes (NULYTELY) 420 g solution, Prepare according to package instructions. Starting at 5:00 PM: Drink one 8 oz glass of mixture every 15 minutes until you finish half of the jug. Five hours prior to procedure, drink 8 oz glass of mixture every 15 minutes until it is all gone. Make sure you do not drink anything 4 hours prior to your procedure., Disp: 4000 mL, Rfl: 0 ?  rosuvastatin (CRESTOR) 10 MG tablet, Take 1 tablet (10 mg total) by mouth daily., Disp: 90  tablet, Rfl: 3 ? ?Observations/Objective: ?Patient is well-developed, well-nourished in no acute distress.  ?Resting comfortably  at home.  ?Head is normocephalic, atraumatic.  ?No labored breathing.  ?Speech is clear and coherent with logical content.  ?Patient is alert and oriented at baseline.  ? ? ?Assessment and Plan: ?1. Gout, unspecified cause, unspecified chronicity, unspecified site ?- ibuprofen (ADVIL) 600 MG tablet; Take 1 tablet (600 mg total) by mouth every 8 (eight) hours as needed.  Dispense: 30 tablet; Refill: 0 ?- colchicine 0.6 MG tablet; Take 2 tablets immediately, then 1 tablet twice daily for up to 7 days max. Discontinue if you develop stomach pains or diarrhea.  Dispense: 14 tablet; Refill: 0 ?- predniSONE (DELTASONE) 20 MG tablet; Take 2 tablets (40 mg total) by mouth daily with breakfast for 7 days.  Dispense: 14 tablet; Refill: 0 ? ?Gout in the setting of food trigger ?Not on daily medication at this time, advised PCP f/u ?Education on foods and causes reviewed and on AVS ?Do not think Colchicine will do much benefit given the duration now, sent to have on hand for next flare as he reports increase of these. Advised if improved will need script for this from PCP. ? ?Patient acknowledged agreement and understanding of the plan.  ? ? Reviewed side effects, risks and benefits of medication.   ? ? ? ?Follow Up Instructions: ?I discussed the assessment and treatment plan with the patient. The patient was provided an opportunity to ask questions and all were answered. The patient agreed with the plan and demonstrated an understanding of the instructions.  A copy of instructions were sent to the patient via MyChart unless otherwise noted below.  ? ? ? ?The patient was advised to call back or seek an in-person evaluation if the symptoms worsen or if the condition fails to improve as anticipated. ? ?Time:  ?I spent 13 minutes with the patient via telehealth technology discussing the above  problems/concerns.   ? ?Bryan Mayo, NP ? ?

## 2021-11-11 ENCOUNTER — Telehealth: Payer: Self-pay

## 2021-11-11 ENCOUNTER — Other Ambulatory Visit: Payer: Self-pay | Admitting: Nurse Practitioner

## 2021-11-11 MED ORDER — ENALAPRIL MALEATE 20 MG PO TABS: 20.0000 mg | ORAL_TABLET | Freq: Two times a day (BID) | ORAL | 0 refills | Status: DC

## 2021-11-11 NOTE — Telephone Encounter (Signed)
Patient scheduled.

## 2021-11-11 NOTE — Telephone Encounter (Signed)
Received a refill request on Enalapril prescription via fax. Patient has not been seen in office since January of last year. Patient does not have any upcoming appointments. Please advise? ?

## 2021-11-13 NOTE — Telephone Encounter (Signed)
Already refilled on 11/11/21, will refuse this request. ? ? ?Requested Prescriptions  ?Pending Prescriptions Disp Refills  ?? enalapril (VASOTEC) 20 MG tablet [Pharmacy Med Name: ENALAPRIL MALEATE 20 MG TAB] 180 tablet 0  ?  Sig: Take 1 tablet (20 mg total) by mouth 2 (two) times daily.  ?  ? Cardiovascular:  ACE Inhibitors Failed - 11/11/2021 12:20 PM  ?  ?  Failed - Cr in normal range and within 180 days  ?  Creatinine, Ser  ?Date Value Ref Range Status  ?07/26/2020 1.06 0.76 - 1.27 mg/dL Final  ?   ?  ?  Failed - K in normal range and within 180 days  ?  Potassium  ?Date Value Ref Range Status  ?07/26/2020 4.3 3.5 - 5.2 mmol/L Final  ?   ?  ?  Failed - Last BP in normal range  ?  BP Readings from Last 1 Encounters:  ?08/22/20 (!) 141/86  ?   ?  ?  Failed - Valid encounter within last 6 months  ?  Recent Outpatient Visits   ?      ? 1 year ago Essential hypertension  ? [facility], Henrine Screws T, NP  ? 1 year ago Immunization due  ? [place], DO  ? 1 year ago Encounter to establish care  ? Jew[place], [place] T, NP  ? 2 years ago Right inguinal pain  ? [facility] Care at [facility], Lilia Argue, MD  ? 2 years ago Essential hypertension, benign  ? [facility] Care at [facility], Lilia Argue, MD  ?  ?  ?Future Appointments   ?        ? In 2 weeks Cannady, Barbaraann Faster, NP [employer]  ?  ? ?  ?  ?  Passed - Patient is not pregnant  ?  ?  ? ? ?

## 2021-11-24 NOTE — Patient Instructions (Signed)

## 2021-11-27 ENCOUNTER — Encounter: Payer: Self-pay | Admitting: Nurse Practitioner

## 2021-11-27 ENCOUNTER — Ambulatory Visit (INDEPENDENT_AMBULATORY_CARE_PROVIDER_SITE_OTHER): Payer: 59 | Admitting: Nurse Practitioner

## 2021-11-27 VITALS — BP 148/98 | HR 71 | Temp 97.4°F | Wt 223.4 lb

## 2021-11-27 DIAGNOSIS — N400 Enlarged prostate without lower urinary tract symptoms: Secondary | ICD-10-CM | POA: Diagnosis not present

## 2021-11-27 DIAGNOSIS — I10 Essential (primary) hypertension: Secondary | ICD-10-CM | POA: Diagnosis not present

## 2021-11-27 DIAGNOSIS — E782 Mixed hyperlipidemia: Secondary | ICD-10-CM

## 2021-11-27 DIAGNOSIS — Z6831 Body mass index (BMI) 31.0-31.9, adult: Secondary | ICD-10-CM

## 2021-11-27 DIAGNOSIS — M79622 Pain in left upper arm: Secondary | ICD-10-CM | POA: Insufficient documentation

## 2021-11-27 DIAGNOSIS — E7800 Pure hypercholesterolemia, unspecified: Secondary | ICD-10-CM

## 2021-11-27 DIAGNOSIS — E6609 Other obesity due to excess calories: Secondary | ICD-10-CM

## 2021-11-27 MED ORDER — SHINGRIX 50 MCG/0.5ML IM SUSR: 0.5000 mL | Freq: Once | INTRAMUSCULAR | 0 refills | Status: AC

## 2021-11-27 MED ORDER — AMLODIPINE BESYLATE 5 MG PO TABS: 5.0000 mg | ORAL_TABLET | Freq: Every day | ORAL | 4 refills | Status: DC

## 2021-11-27 MED ORDER — SHINGRIX 50 MCG/0.5ML IM SUSR: 0.5000 mL | Freq: Once | INTRAMUSCULAR | 0 refills | Status: DC

## 2021-11-27 NOTE — Assessment & Plan Note (Addendum)
Ongoing, stable.  Recheck lipid panel today and continue current regimen.  Adjust as needed. ?

## 2021-11-27 NOTE — Assessment & Plan Note (Signed)
Acute for a couple days.  No red flag symptoms with this.  Overall exam reassuring, suspect some shoulder impingement present.  Recommend Voltaren gel at home and rest.  Tylenol as needed.  Return for worsening or ongoing pain. ?

## 2021-11-27 NOTE — Assessment & Plan Note (Signed)
Chronic, ongoing with BP remaining above goal on recheck today.  He has been losing weight, down 7 pounds, and focused on DASH diet.  At this time continue Enalapril at max dose and add on Amlodipine 5 MG daily, educated him on this medication and side effects to monitor for.  Recommend he monitor BP at least a few mornings a week at home and document.  DASH diet at home.  Labs today: CBC, CMP, TSH.  Return in 5 weeks. ? ?

## 2021-11-27 NOTE — Progress Notes (Signed)
? ?BP (!) 148/98 (BP Location: Left Arm, Patient Position: Sitting, Cuff Size: Normal)   Pulse 71   Temp (!) 97.4 ?F (36.3 ?C) (Oral)   Wt 223 lb 6.4 oz (101.3 kg)   SpO2 98%   BMI 31.16 kg/m?   ? ?Subjective:  ? ? Patient ID: Bryan Mccoy, male    DOB: Jul 24, 1970, 51 y.o.   MRN: [id number] ? ?HPI: ?Bryan Mccoy is a 51 y.o. male ? ?Chief Complaint  ?Patient presents with  ? Benign Prostatic Hypertrophy  ? Hypertension  ? Elevated LDL   ? Arm Pain  ?  Patient states he has been having some pain in his left arm. Patient says he has had the pain for the past couple of days and says the pain comes and goes. Patient states he may have over extended his arm, but wanted to discuss with provider.   ? ?HYPERTENSION / HYPERLIPIDEMIA ?Continues on Enalapril and Rosuvastatin.  Is a single father to two young children.  Still having occasional gout flares -- takes colchicine which helps this.  Last flare 2 weeks ago. ?Satisfied with current treatment? yes ?Duration of hypertension: chronic ?BP monitoring frequency: a few times a day ?BP range: with wrist cuff, so occasional elevations ?BP medication side effects: no ?Past BP meds: HCTZ -- due to gout issues stopped this in May 2022 ?Duration of hyperlipidemia: chronic ?Cholesterol medication side effects: no ?Cholesterol supplements: none ?Past cholesterol medications: Crestor ?Medication compliance: good compliance ?Aspirin: no ?Recent stressors: no ?Recurrent headaches: no ?Visual changes: no ?Palpitations: no ?Dyspnea: no ?Chest pain: no ?Lower extremity edema: no ?Dizzy/lightheaded: no  ?The 10-year ASCVD risk score (Arnett DK, et al., 2019) is: 5.1% ?  Values used to calculate the score: ?    Age: 54 years ?    Sex: Male ?    Is Non-Hispanic African American: No ?    Diabetic: No ?    Tobacco smoker: No ?    Systolic Blood Pressure: 443 mmHg ?    Is BP treated: Yes ?    HDL Cholesterol: 40 mg/dL ?    Total Cholesterol: 167 mg/dL ? ?BPH ?No current  medications. ?Duration: chronic ?Nocturia: 1/night ?Urinary frequency: drinks a lot of water during daytime ?Incomplete voiding: no ?Urgency: no ?Weak urinary stream: no ?Straining to start stream: no ?Dysuria: no ?IPSS Questionnaire (AUA-7):  2 ?Over the past month?   ?1)  How often have you had a sensation of not emptying your bladder completely after you finish urinating?  0 - Not at all  ?2)  How often have you had to urinate again less than two hours after you finished urinating? 1 - Less than 1 time in 5  ?3)  How often have you found you stopped and started again several times when you urinated?  0 - Not at all  ?4) How difficult have you found it to postpone urination?  0 - Not at all  ?5) How often have you had a weak urinary stream?  0 - Not at all  ?6) How often have you had to push or strain to begin urination?  0 - Not at all  ?7) How many times did you most typically get up to urinate from the time you went to bed until the time you got up in the morning?  1 - 1 time  ?Total score:  0-7 mildly symptomatic  ? 8-19 moderately symptomatic  ? 20-35 severely symptomatic  ?  ?ARM PAIN (  LEFT) ?Started the day before yesterday -- had moved a trampoline and sand bags. ?Duration: days ?Location: left ?Mechanism of injury: unknown ?Onset: sudden ?Severity: 7/10  ?Quality:  sharp, aching, shooting, and throbbing ?Frequency: intermittent ?Radiation: no ?Aggravating factors: nothing he can think of  ?Alleviating factors: nothing use  ?Status: fluctuating ?Treatments attempted: none  ?Relief with NSAIDs?:  No NSAIDs Taken ?Swelling: no ?Redness: no  ?Warmth: no ?Trauma: no ?Chest pain: no  ?Shortness of breath: no  ?Fever: no ?Decreased sensation: no ?Paresthesias: no ?Weakness: no  ? ?Relevant past medical, surgical, family and social history reviewed and updated as indicated. Interim medical history since our last visit reviewed. ?Allergies and medications reviewed and updated. ? ?Review of Systems   ?Constitutional:  Negative for activity change, diaphoresis, fatigue and fever.  ?Respiratory:  Negative for cough, chest tightness, shortness of breath and wheezing.   ?Cardiovascular:  Negative for chest pain, palpitations and leg swelling.  ?Gastrointestinal: Negative.   ?Neurological: Negative.   ?Psychiatric/Behavioral: Negative.    ? ?Per HPI unless specifically indicated above ? ?   ?Objective:  ?  ?BP (!) 148/98 (BP Location: Left Arm, Patient Position: Sitting, Cuff Size: Normal)   Pulse 71   Temp (!) 97.4 ?F (36.3 ?C) (Oral)   Wt 223 lb 6.4 oz (101.3 kg)   SpO2 98%   BMI 31.16 kg/m?   ?Wt Readings from Last 3 Encounters:  ?11/27/21 223 lb 6.4 oz (101.3 kg)  ?08/22/20 230 lb (104.3 kg)  ?07/26/20 228 lb 3.7 oz (103.5 kg)  ?  ?Physical Exam ?Vitals and nursing note reviewed.  ?Constitutional:   ?   General: He is awake. He is not in acute distress. ?   Appearance: He is well-developed and well-groomed. He is obese. He is not ill-appearing.  ?HENT:  ?   Head: Normocephalic and atraumatic.  ?   Right Ear: Hearing normal. No drainage.  ?   Left Ear: Hearing normal. No drainage.  ?Eyes:  ?   General: Lids are normal.     ?   Right eye: No discharge.     ?   Left eye: No discharge.  ?   Conjunctiva/sclera: Conjunctivae normal.  ?   Pupils: Pupils are equal, round, and reactive to light.  ?Neck:  ?   Thyroid: No thyromegaly.  ?   Vascular: No carotid bruit.  ?   Trachea: Trachea normal.  ?Cardiovascular:  ?   Rate and Rhythm: Normal rate and regular rhythm.  ?   Heart sounds: Normal heart sounds, S1 normal and S2 normal. No murmur heard. ?  No gallop.  ?Pulmonary:  ?   Effort: Pulmonary effort is normal.  ?   Breath sounds: Normal breath sounds.  ?Abdominal:  ?   General: Bowel sounds are normal.  ?   Palpations: Abdomen is soft. There is no hepatomegaly or splenomegaly.  ?Musculoskeletal:     ?   General: Normal range of motion.  ?   Right shoulder: No swelling, tenderness, bony tenderness or crepitus.  Normal range of motion. Normal strength.  ?   Left shoulder: No swelling, tenderness, bony tenderness or crepitus. Normal range of motion. Normal strength.  ?   Right upper arm: No swelling, edema, lacerations, tenderness or bony tenderness.  ?   Left upper arm: No swelling, edema, lacerations, tenderness or bony tenderness.  ?   Cervical back: Normal range of motion and neck supple.  ?   Right lower leg: No edema.  ?  Left lower leg: No edema.  ?Skin: ?   General: Skin is warm and dry.  ?   Capillary Refill: Capillary refill takes less than 2 seconds.  ?   Findings: No rash.  ?Neurological:  ?   Mental Status: He is alert and oriented to person, place, and time.  ?   Deep Tendon Reflexes: Reflexes are normal and symmetric.  ?Psychiatric:     ?   Attention and Perception: Attention normal.     ?   Mood and Affect: Mood normal.     ?   Speech: Speech normal.     ?   Behavior: Behavior normal. Behavior is cooperative.     ?   Thought Content: Thought content normal.  ? ? ?Results for orders placed or performed during the hospital encounter of 08/22/20  ?Surgical pathology  ?Result Value Ref Range  ? SURGICAL PATHOLOGY    ?  SURGICAL PATHOLOGY ?CASE: [id number] ?PATIENT: [facility] Rolene Arbour ?Surgical Pathology Report ? ? ? ? ?Specimen Submitted: ?A. Colon polyp x2, sigmoid; cs ? ?Clinical History: Positive colorectal screening using cologuard test, ?diverticulosis, polyps ? ? ? ? ? ?DIAGNOSIS: ?A. COLON POLYP X2, SIGMOID; COLD SNARE: ?- HYPERPLASTIC POLYP, MULTIPLE FRAGMENTS. ?- NEGATIVE FOR DYSPLASIA AND MALIGNANCY. ? ? ?GROSS DESCRIPTION: ?A. Labeled: Cold snare sigmoid colon polyp x2 ?Received: In formalin ?Collection time: 10:53 AM on 08/22/2020 ?Placed into formalin time: 10:53 AM on 08/22/2020 ?Tissue fragment(s): Three ?Size: Aggregate, 1.9 x 1.0 x 0.1 cm ?Description: Three fragments of white-red soft tissue mixed with ?intestinal debris.  The largest two fragments are inked at the margin ?and serially sectioned.   Specimen is comprised of 95% soft tissue, 5% ?intestinal debris. ?Entirely submitted in cassettes one through three as follows: Cassette ?1-1 fragment serially sectioned, cassette 2-1 fragment s erially ?sectioned, cassette 3-remaining specimen. ?

## 2021-11-27 NOTE — Assessment & Plan Note (Signed)
Has lost 7 pounds since last visit, praised for this.  Recommended eating smaller high protein, low fat meals more frequently and exercising 30 mins a day 5 times a week with a goal of 10-15lb weight loss in the next 3 months. Patient voiced their understanding and motivation to adhere to these recommendations. ? ?

## 2021-11-28 ENCOUNTER — Encounter: Payer: Self-pay | Admitting: Nurse Practitioner

## 2021-11-28 LAB — LIPID PANEL W/O CHOL/HDL RATIO
Cholesterol, Total: 187 mg/dL (ref 100–199)
HDL: 40 mg/dL (ref >39)
LDL Chol Calc (NIH): 122 mg/dL — ABNORMAL HIGH (ref 0–99)
Triglycerides: 141 mg/dL (ref 0–149)
VLDL Cholesterol Cal: 25 mg/dL (ref 5–40)

## 2021-11-28 LAB — CBC WITH DIFFERENTIAL/PLATELET
Basophils Absolute: 0.1 10*3/uL (ref 0.0–0.2)
Basos: 1 %
EOS (ABSOLUTE): 0.2 10*3/uL (ref 0.0–0.4)
Eos: 3 %
Hematocrit: 43.2 % (ref 37.5–51.0)
Hemoglobin: 14.5 g/dL (ref 13.0–17.7)
Immature Grans (Abs): 0.1 10*3/uL (ref 0.0–0.1)
Immature Granulocytes: 1 %
Lymphocytes Absolute: 1.8 10*3/uL (ref 0.7–3.1)
Lymphs: 27 %
MCH: 29.1 pg (ref 26.6–33.0)
MCHC: 33.6 g/dL (ref 31.5–35.7)
MCV: 87 fL (ref 79–97)
Monocytes Absolute: 0.4 10*3/uL (ref 0.1–0.9)
Monocytes: 6 %
Neutrophils Absolute: 4.1 10*3/uL (ref 1.4–7.0)
Neutrophils: 62 %
Platelets: 272 10*3/uL (ref 150–450)
RBC: 4.98 x10E6/uL (ref 4.14–5.80)
RDW: 13.2 % (ref 11.6–15.4)
WBC: 6.6 10*3/uL (ref 3.4–10.8)

## 2021-11-28 LAB — COMPREHENSIVE METABOLIC PANEL
ALT: 19 IU/L (ref 0–44)
AST: 23 IU/L (ref 0–40)
Albumin/Globulin Ratio: 2.2 (ref 1.2–2.2)
Albumin: 4.9 g/dL (ref 4.0–5.0)
Alkaline Phosphatase: 95 IU/L (ref 44–121)
BUN/Creatinine Ratio: 12 (ref 9–20)
BUN: 13 mg/dL (ref 6–24)
Bilirubin Total: 0.3 mg/dL (ref 0.0–1.2)
CO2: 24 mmol/L (ref 20–29)
Calcium: 8.9 mg/dL (ref 8.7–10.2)
Chloride: 102 mmol/L (ref 96–106)
Creatinine, Ser: 1.09 mg/dL (ref 0.76–1.27)
Globulin, Total: 2.2 g/dL (ref 1.5–4.5)
Glucose: 99 mg/dL (ref 70–99)
Potassium: 4.5 mmol/L (ref 3.5–5.2)
Sodium: 140 mmol/L (ref 134–144)
Total Protein: 7.1 g/dL (ref 6.0–8.5)
eGFR: 83 mL/min/{1.73_m2} (ref >59)

## 2021-11-28 LAB — PSA: Prostate Specific Ag, Serum: 0.3 ng/mL (ref 0.0–4.0)

## 2021-11-28 LAB — TSH: TSH: 1.55 u[IU]/mL (ref 0.450–4.500)

## 2021-11-28 NOTE — Progress Notes (Signed)
Contacted via [place] ? ? ?Good morning Bryan Mccoy, your labs have returned: ?- Kidney function, creatinine and eGFR, remains normal, as is liver function, AST and ALT.   ?- CBC shows no anemia or infection ?- Thyroid lab (TSH) is normal, as is prostate screening.   ?- Cholesterol labs continue to show some elevation in LDL, are you taking Rosuvastatin daily?  Let me know as I may increase dose if you are to get better control of levels.  Any questions? ?Keep being amazing!!  Thank you for allowing me to participate in your care.  I appreciate you. ?Kindest regards, ?Carmen Vallecillo ?

## 2021-12-13 ENCOUNTER — Ambulatory Visit
Admission: EM | Admit: 2021-12-13 | Discharge: 2021-12-13 | Disposition: A | Discharge status: Home / Self Care | Payer: 59

## 2021-12-13 ENCOUNTER — Encounter: Payer: Self-pay | Admitting: Emergency Medicine

## 2021-12-13 DIAGNOSIS — H6123 Impacted cerumen, bilateral: Secondary | ICD-10-CM | POA: Diagnosis not present

## 2021-12-13 NOTE — ED Triage Notes (Signed)
Pt presents with left ear pain x 3 days

## 2021-12-13 NOTE — ED Provider Notes (Signed)
[name]    CSN: [id number] Arrival date & time: 12/13/21  1821      History   Chief Complaint Chief Complaint  Patient presents with   Otalgia    HPI Bryan Mccoy is a 51 y.o. male.   HPI Patient present for evaluation of left ear pain. He endorses diminished hearing and ear fullness. He has experienced cerumen impaction previously. Denies fever or any URI symptoms.   Past Medical History:  Diagnosis Date   Allergy    Anxiety    Depression    Hypercholesteremia    Hypertension     Patient Active Problem List   Diagnosis Date Noted   Left upper arm pain 11/27/2021   Polyp of colon    Obesity 07/26/2020   Gout 06/26/2020   Hyperlipidemia, mixed 12/25/2019   Essential hypertension 12/01/2019    Past Surgical History:  Procedure Laterality Date   ANKLE FRACTURE SURGERY     right   APPENDECTOMY     COLONOSCOPY WITH PROPOFOL N/A 08/22/2020   Procedure: COLONOSCOPY WITH PROPOFOL;  Surgeon: Virgel Manifold, MD;  Location: [facility] ENDOSCOPY;  Service: Endoscopy;  Laterality: N/A;   FRACTURE SURGERY         Home Medications    Prior to Admission medications   Medication Sig Start Date End Date Taking? Authorizing Provider  amLODipine (NORVASC) 5 MG tablet Take 1 tablet (5 mg total) by mouth daily. 11/27/21   Marnee Guarneri T, NP  aspirin 81 MG EC tablet Take 81 mg by mouth daily. Swallow whole.    [provider]  colchicine 0.6 MG tablet Take 2 tablets immediately, then 1 tablet twice daily for up to 7 days max. Discontinue if you develop stomach pains or diarrhea. 10/15/21   Perlie Mayo, NP  enalapril (VASOTEC) 20 MG tablet Take 1 tablet (20 mg total) by mouth 2 (two) times daily. Need visit for further refills. 11/11/21   Cannady, Henrine Screws T, NP  ibuprofen (ADVIL) 600 MG tablet Take 1 tablet (600 mg total) by mouth every 8 (eight) hours as needed. 10/15/21   Perlie Mayo, NP  rosuvastatin (CRESTOR) 10 MG tablet Take 1 tablet (10 mg  total) by mouth daily. 08/30/20   Cannady, Henrine Screws T, NP  vitamin B-6 (PYRIDOXINE) 25 MG tablet Take 25 mg by mouth daily.    [provider]  Zoster Vaccine Adjuvanted [facility]) injection Inject 0.5 mLs into the muscle once for 1 dose. Dose # 2 01/29/22 01/29/22  Venita Lick, NP    Family History Family History  Problem Relation Age of Onset   Hyperlipidemia Mother    Hypertension Mother    Migraines Sister    Hyperlipidemia Brother    Allergies Son    Lung cancer Maternal Grandfather    Diabetes Paternal Grandmother    Alzheimer's disease Paternal Grandfather     Social History Social History   Tobacco Use   Smoking status: Former    Packs/day: 1.50    Years: 10.00    Pack years: 15.00    Types: Cigarettes    Quit date: 2010    Years since quitting: 13.4   Smokeless tobacco: Never  Vaping Use   Vaping Use: Never used  Substance Use Topics   Alcohol use: Yes    Comment: very rare   Drug use: Never     Allergies   Milk-related compounds and Peanut-containing drug products   Review of Systems Review of Systems Pertinent negatives  listed in HPI  Physical Exam Triage Vital Signs ED Triage Vitals  Enc Vitals Group     BP 12/13/21 1830 (!) 179/114     Pulse Rate 12/13/21 1830 88     Resp 12/13/21 1830 18     Temp 12/13/21 1830 97.9 F (36.6 C)     Temp Source 12/13/21 1830 Oral     SpO2 12/13/21 1830 98 %     Weight --      Height --      Head Circumference --      Peak Flow --      Pain Score 12/13/21 1831 4     Pain Loc --      Pain Edu? --      Excl. in [place]? --    No data found.  Updated Vital Signs BP (!) 179/114 (BP Location: Left Arm)   Pulse 88   Temp 97.9 F (36.6 C) (Oral)   Resp 18   SpO2 98%   Visual Acuity Right Eye Distance:   Left Eye Distance:   Bilateral Distance:    Right Eye Near:   Left Eye Near:    Bilateral Near:     Physical Exam Vitals reviewed.  Constitutional:      Appearance: Normal appearance.   HENT:     Head: Normocephalic and atraumatic.     Ears:     Comments: Cerumen removed by RN  Bilateral TM visible. No erythema or TM perforation prese Cardiovascular:     Rate and Rhythm: Normal rate.  Pulmonary:     Effort: Pulmonary effort is normal.     Breath sounds: Normal breath sounds and air entry.  Neurological:     Mental Status: He is alert.  Psychiatric:        Attention and Perception: Attention normal.        Mood and Affect: Mood normal.        Speech: Speech normal.    UC Treatments / Results  Labs (all labs ordered are listed, but only abnormal results are displayed) Labs Reviewed - No data to display  EKG   Radiology No results found.  Procedures Procedures (including critical care time)  Medications Ordered in UC Medications - No data to display  Initial Impression / Assessment and Plan / UC Course  I have reviewed the triage vital signs and the nursing notes.  Pertinent labs & imaging results that were available during my care of the patient were reviewed by me and considered in my medical decision making (see chart for details).    Bilateral cerumen impaction, RN ear lavage performed. Patient tolerated. Cerumen removed. No additional treatment warranted at this time. Return as needed. Final Clinical Impressions(s) / UC Diagnoses   Final diagnoses:  Excessive cerumen in ear canal, bilateral   Discharge Instructions   None    ED Prescriptions   None    PDMP not reviewed this encounter.   Scot Jun, [place] 12/15/21 [id number]

## 2021-12-19 ENCOUNTER — Other Ambulatory Visit: Payer: Self-pay | Admitting: Nurse Practitioner

## 2021-12-19 NOTE — Telephone Encounter (Signed)
Patient was seen here in office on 11/27/21 and patient blood pressure medication was not sent after visit and patient is here in the office requesting a refill. Provider is now out of the office for a week.

## 2022-01-01 ENCOUNTER — Encounter: Payer: Self-pay | Admitting: Nurse Practitioner

## 2022-01-02 ENCOUNTER — Ambulatory Visit (INDEPENDENT_AMBULATORY_CARE_PROVIDER_SITE_OTHER): Payer: 59 | Admitting: Physician Assistant

## 2022-01-02 ENCOUNTER — Ambulatory Visit: Payer: Self-pay

## 2022-01-02 ENCOUNTER — Encounter: Payer: Self-pay | Admitting: Physician Assistant

## 2022-01-02 VITALS — BP 171/127 | HR 74 | Temp 98.0°F | Ht 70.98 in | Wt 221.0 lb

## 2022-01-02 DIAGNOSIS — Z202 Contact with and (suspected) exposure to infections with a predominantly sexual mode of transmission: Secondary | ICD-10-CM | POA: Diagnosis not present

## 2022-01-02 NOTE — Telephone Encounter (Signed)
    Chief Complaint: Exposure to chlamydia Symptoms: No Frequency: 2 weeks ago Pertinent Negatives: Patient denies  Disposition: [] ED /[] Urgent Care (no appt availability in office) / [x] Appointment(In office/virtual)/ []  [place] [facility]/ [] Home Care/ [] Refused Recommended Disposition /[] [place] [facility]/ []  Follow-up with PCP Additional Notes:   Reason for Disposition  Sex partner of someone who was diagnosed with an STI  (Exception: Male exposed to bacterial vaginosis or vaginal yeast infection)  Answer Assessment - Initial Assessment Questions 1. MAIN CONCERN: "What were you exposed to?"  "What sexually transmitted infection (STI) does your sex partner have?" (e.g., gonorrhea, herpes, HIV, pubic lice)     Chlamydia  2. ROUTE of EXPOSURE: "How were you exposed to the STI?" (e.g., oral, vaginal, or rectal intercourse)     Intercourse 3. DATE of EXPOSURE: "When did the exposure occur?" (e.g., days)     2 weeks ago 4. SYMPTOMS: "Do you have any symptoms?" (e.g., pain with urination, rash, sores)     No 5. PREGNANCY: "Is there any chance you are pregnant?" "When was your last menstrual period?"     N/a  Protocols used: STI Exposure-A-AH

## 2022-01-02 NOTE — Progress Notes (Signed)
Established Patient Office Visit  Name: Bryan Mccoy   MRN: [id number]    DOB: 14-Mar-1971   Date:01/02/2022  Today's Provider: Talitha Givens, MHS, PA-C Introduced myself to the patient as a PA-C and provided education on APPs in clinical practice.         Subjective  Chief Complaint  Chief Complaint  Patient presents with   Exposure to STD    Was informed last night that his partner was exposed to Chlamydia and is currently waiting on results. Patient states that he has not noticed any symptoms.    HPI  Reports he has had exposure to potential Chlamydia Reports potential exposure occurred a few weeks ago Denies swelling or tenderness to penis or testicles, denies skin lesions,  Denies concerns for HSV, HIV, and syphilis     Patient Active Problem List   Diagnosis Date Noted   Left upper arm pain 11/27/2021   Polyp of colon    Obesity 07/26/2020   Gout 06/26/2020   Hyperlipidemia, mixed 12/25/2019   Essential hypertension 12/01/2019    Past Surgical History:  Procedure Laterality Date   ANKLE FRACTURE SURGERY     right   APPENDECTOMY     COLONOSCOPY WITH PROPOFOL N/A 08/22/2020   Procedure: COLONOSCOPY WITH PROPOFOL;  Surgeon: Virgel Manifold, MD;  Location: [facility] ENDOSCOPY;  Service: Endoscopy;  Laterality: N/A;   FRACTURE SURGERY      Family History  Problem Relation Age of Onset   Hyperlipidemia Mother    Hypertension Mother    Migraines Sister    Hyperlipidemia Brother    Allergies Son    Lung cancer Maternal Grandfather    Diabetes Paternal Grandmother    Alzheimer's disease Paternal Grandfather     Social History   Tobacco Use   Smoking status: Former    Packs/day: 1.50    Years: 10.00    Total pack years: 15.00    Types: Cigarettes    Quit date: 2010    Years since quitting: 13.4   Smokeless tobacco: Never  Substance Use Topics   Alcohol use: Yes    Comment: very rare     Current Outpatient Medications:    amLODipine  (NORVASC) 5 MG tablet, Take 1 tablet (5 mg total) by mouth daily., Disp: 90 tablet, Rfl: 4   aspirin 81 MG EC tablet, Take 81 mg by mouth daily. Swallow whole., Disp: , Rfl:    colchicine 0.6 MG tablet, Take 2 tablets immediately, then 1 tablet twice daily for up to 7 days max. Discontinue if you develop stomach pains or diarrhea., Disp: 14 tablet, Rfl: 0   enalapril (VASOTEC) 20 MG tablet, Take 1 tablet (20 mg total) by mouth 2 (two) times daily. Need visit for further refills., Disp: 60 tablet, Rfl: 0   ibuprofen (ADVIL) 600 MG tablet, Take 1 tablet (600 mg total) by mouth every 8 (eight) hours as needed., Disp: 30 tablet, Rfl: 0   rosuvastatin (CRESTOR) 10 MG tablet, Take 1 tablet (10 mg total) by mouth daily., Disp: 90 tablet, Rfl: 3   vitamin B-6 (PYRIDOXINE) 25 MG tablet, Take 25 mg by mouth daily., Disp: , Rfl:    [START ON 01/29/2022] Zoster Vaccine Adjuvanted [facility]) injection, Inject 0.5 mLs into the muscle once for 1 dose. Dose # 2 (Patient not taking: Reported on 01/02/2022), Disp: 0.5 mL, Rfl: 0  Allergies  Allergen Reactions   Milk-Related Compounds    Peanut-Containing Drug Products  I personally reviewed active problem list, medication list, allergies, lab results with the patient/caregiver today.   Review of Systems  Constitutional:  Negative for chills, diaphoresis and fever.  HENT:  Negative for sore throat.   Genitourinary:  Negative for dysuria and frequency.  Skin:  Negative for rash.      Objective  Vitals:   01/02/22 1118 01/02/22 1121  BP: (!) 171/119 (!) 171/127  Pulse: 73 74  Temp: 98 F (36.7 C)   TempSrc: Oral   SpO2: 96%   Weight: 221 lb (100.2 kg)   Height: 5' 10.98" (1.803 m)     Body mass index is 30.84 kg/m.  Physical Exam Vitals reviewed.  Constitutional:      General: He is awake.     Appearance: Normal appearance. He is well-developed and well-groomed. He is obese.  HENT:     Head: Normocephalic and atraumatic.   Cardiovascular:     Rate and Rhythm: Normal rate and regular rhythm.     Pulses: Normal pulses.          Radial pulses are 2+ on the right side and 2+ on the left side.     Heart sounds: Normal heart sounds. No murmur heard.    No friction rub. No gallop.  Pulmonary:     Effort: Pulmonary effort is normal.  Musculoskeletal:     Right lower leg: No edema.     Left lower leg: No edema.  Neurological:     General: No focal deficit present.     Mental Status: He is alert and oriented to person, place, and time.     GCS: GCS eye subscore is 4. GCS verbal subscore is 5. GCS motor subscore is 6.  Psychiatric:        Attention and Perception: Attention normal.        Mood and Affect: Mood normal.        Behavior: Behavior normal. Behavior is cooperative.      Recent Results (from the past 2160 hour(s))  CBC with Differential/Platelet     Status: None   Collection Time: 11/27/21 10:27 AM  Result Value Ref Range   WBC 6.6 3.4 - 10.8 x10E3/uL   RBC 4.98 4.14 - 5.80 x10E6/uL   Hemoglobin 14.5 13.0 - 17.7 g/dL   Hematocrit 43.2 37.5 - 51.0 %   MCV 87 79 - 97 fL   MCH 29.1 26.6 - 33.0 pg   MCHC 33.6 31.5 - 35.7 g/dL   RDW 13.2 11.6 - 15.4 %   Platelets 272 150 - 450 x10E3/uL   Neutrophils 62 Not Estab. %   Lymphs 27 Not Estab. %   Monocytes 6 Not Estab. %   Eos 3 Not Estab. %   Basos 1 Not Estab. %   Neutrophils Absolute 4.1 1.4 - 7.0 x10E3/uL   Lymphocytes Absolute 1.8 0.7 - 3.1 x10E3/uL   Monocytes Absolute 0.4 0.1 - 0.9 x10E3/uL   EOS (ABSOLUTE) 0.2 0.0 - 0.4 x10E3/uL   Basophils Absolute 0.1 0.0 - 0.2 x10E3/uL   Immature Granulocytes 1 Not Estab. %   Immature Grans (Abs) 0.1 0.0 - 0.1 x10E3/uL  Comprehensive metabolic panel     Status: None   Collection Time: 11/27/21 10:27 AM  Result Value Ref Range   Glucose 99 70 - 99 mg/dL   BUN 13 6 - 24 mg/dL   Creatinine, Ser 1.09 0.76 - 1.27 mg/dL   eGFR 83 >59 mL/min/1.73   BUN/Creatinine Ratio 12 9 -  20   Sodium 140 134 - 144  mmol/L   Potassium 4.5 3.5 - 5.2 mmol/L   Chloride 102 96 - 106 mmol/L   CO2 24 20 - 29 mmol/L   Calcium 8.9 8.7 - 10.2 mg/dL   Total Protein 7.1 6.0 - 8.5 g/dL   Albumin 4.9 4.0 - 5.0 g/dL   Globulin, Total 2.2 1.5 - 4.5 g/dL   Albumin/Globulin Ratio 2.2 1.2 - 2.2   Bilirubin Total 0.3 0.0 - 1.2 mg/dL   Alkaline Phosphatase 95 44 - 121 IU/L   AST 23 0 - 40 IU/L   ALT 19 0 - 44 IU/L  Lipid Panel w/o Chol/HDL Ratio     Status: Abnormal   Collection Time: 11/27/21 10:27 AM  Result Value Ref Range   Cholesterol, Total 187 100 - 199 mg/dL   Triglycerides 141 0 - 149 mg/dL   HDL 40 >39 mg/dL   VLDL Cholesterol Cal 25 5 - 40 mg/dL   LDL Chol Calc (NIH) 122 (H) 0 - 99 mg/dL  TSH     Status: None   Collection Time: 11/27/21 10:27 AM  Result Value Ref Range   TSH 1.550 0.450 - 4.500 uIU/mL  PSA     Status: None   Collection Time: 11/27/21 10:27 AM  Result Value Ref Range   Prostate Specific Ag, Serum 0.3 0.0 - 4.0 ng/mL    Comment: Roche ECLIA methodology. According to the American Urological Association, Serum PSA should decrease and remain at undetectable levels after radical prostatectomy. The AUA defines biochemical recurrence as an initial PSA value 0.2 ng/mL or greater followed by a subsequent confirmatory PSA value 0.2 ng/mL or greater. Values obtained with different assay methods or kits cannot be used interchangeably. Results cannot be interpreted as absolute evidence of the presence or absence of malignant disease.      PHQ2/9:    01/02/2022   11:24 AM 11/27/2021    9:54 AM 07/26/2020    9:09 AM 12/01/2019    9:57 AM 09/02/2019    8:53 AM  Depression screen PHQ 2/9  Decreased Interest 0 0 0 3 0  Down, Depressed, Hopeless 0 1 0 1 0  PHQ - 2 Score 0 1 0 4 0  Altered sleeping 0 1  1   Tired, decreased energy _0 Change in appetite 0 1  1   Feeling bad or failure about yourself  0 1  0   Trouble concentrating 0 0  0   Moving slowly or fidgety/restless 0 0  0    Suicidal thoughts 0 0  0   PHQ-9 Score _1 Difficult doing work/chores Not difficult at all   Somewhat difficult       Fall Risk:    01/02/2022   11:24 AM 11/27/2021    9:54 AM 07/26/2020    9:08 AM 12/01/2019    9:57 AM 09/02/2019    8:53 AM  Fall Risk   Falls in the past year? 0 0 0 0 0  Number falls in past yr: 0 0  0 0  Injury with Fall? 0 0  0 0  Risk for fall due to : No Fall Risks No Fall Risks     Follow up Falls evaluation completed Falls evaluation completed  Falls evaluation completed       Functional Status Survey:      Assessment & Plan  Problem List Items Addressed This Visit  None Visit Diagnoses     Possible exposure to STD    -  Primary Acute, new concern Reports a recent partner contacted him stating they were awaiting results and had an exposure to Chlamydia themselves Patient declined testing for HIV, HSV and syphilis today stating he was only really concerned with gonorrhea, chlamydia and trich Discussed testing and management if results are positive He voiced understanding and agreement of proposed plan Results to dictate further management    Relevant Orders   Chlamydia/Gonococcus/Trichomonas, NAA([facility])        No follow-ups on file.   I, Charnise Lovan E Jenifer Struve, PA-C, have reviewed all documentation for this visit. The documentation on 01/02/22 for the exam, diagnosis, procedures, and orders are all accurate and complete.   Talitha Givens, MHS, PA-C [facility]

## 2022-01-05 LAB — CHLAMYDIA/GONOCOCCUS/TRICHOMONAS, NAA
Chlamydia by NAA: NEGATIVE
Gonococcus by NAA: NEGATIVE
Trich vag by NAA: NEGATIVE

## 2022-01-10 ENCOUNTER — Ambulatory Visit: Payer: 59 | Admitting: Nurse Practitioner

## 2022-07-07 ENCOUNTER — Ambulatory Visit
Admission: EM | Admit: 2022-07-07 | Discharge: 2022-07-07 | Disposition: A | Discharge status: Home / Self Care | Payer: 59 | Attending: Urgent Care | Admitting: Urgent Care

## 2022-07-07 DIAGNOSIS — J22 Unspecified acute lower respiratory infection: Secondary | ICD-10-CM

## 2022-07-07 MED ORDER — AZITHROMYCIN 250 MG PO TABS: ORAL_TABLET | ORAL | 0 refills | Status: DC

## 2022-07-07 NOTE — Discharge Instructions (Addendum)
Follow up here or with your primary care provider if your symptoms are worsening or not improving with treatment.     

## 2022-07-07 NOTE — ED Triage Notes (Signed)
Pt.presents to UC w/ c/o a cough for the past two days. Pt. Also endorses a fever and body aches that have now resolved.

## 2022-07-07 NOTE — ED Provider Notes (Signed)
[name]    CSN: [id number] Arrival date & time: 07/07/22  1119      History   Chief Complaint No chief complaint on file.   HPI Bryan Mccoy is a 51 y.o. male.   HPI  Presents to urgent care with complaint of cough x 3 days (since Friday).  He endorses on and off fever and body aches that he treated with fever reducing medication.  Fever is resolved.  Awoke this morning with feeling of burning in his chest.  Past Medical History:  Diagnosis Date   Allergy    Anxiety    Depression    Hypercholesteremia    Hypertension     Patient Active Problem List   Diagnosis Date Noted   Left upper arm pain 11/27/2021   Polyp of colon    Obesity 07/26/2020   Gout 06/26/2020   Hyperlipidemia, mixed 12/25/2019   Essential hypertension 12/01/2019    Past Surgical History:  Procedure Laterality Date   ANKLE FRACTURE SURGERY     right   APPENDECTOMY     COLONOSCOPY WITH PROPOFOL N/A 08/22/2020   Procedure: COLONOSCOPY WITH PROPOFOL;  Surgeon: Virgel Manifold, MD;  Location: [facility] ENDOSCOPY;  Service: Endoscopy;  Laterality: N/A;   FRACTURE SURGERY         Home Medications    Prior to Admission medications   Medication Sig Start Date End Date Taking? Authorizing Provider  amLODipine (NORVASC) 5 MG tablet Take 1 tablet (5 mg total) by mouth daily. 11/27/21   Marnee Guarneri T, NP  aspirin 81 MG EC tablet Take 81 mg by mouth daily. Swallow whole.    [provider]  colchicine 0.6 MG tablet Take 2 tablets immediately, then 1 tablet twice daily for up to 7 days max. Discontinue if you develop stomach pains or diarrhea. 10/15/21   Perlie Mayo, NP  enalapril (VASOTEC) 20 MG tablet Take 1 tablet (20 mg total) by mouth 2 (two) times daily. Need visit for further refills. 12/19/21   Jon Billings, NP  ibuprofen (ADVIL) 600 MG tablet Take 1 tablet (600 mg total) by mouth every 8 (eight) hours as needed. 10/15/21   Perlie Mayo, NP  rosuvastatin  (CRESTOR) 10 MG tablet Take 1 tablet (10 mg total) by mouth daily. 08/30/20   Cannady, Henrine Screws T, NP  vitamin B-6 (PYRIDOXINE) 25 MG tablet Take 25 mg by mouth daily.    [provider]    Family History Family History  Problem Relation Age of Onset   Hyperlipidemia Mother    Hypertension Mother    Migraines Sister    Hyperlipidemia Brother    Allergies Son    Lung cancer Maternal Grandfather    Diabetes Paternal Grandmother    Alzheimer's disease Paternal Grandfather     Social History Social History   Tobacco Use   Smoking status: Former    Packs/day: 1.50    Years: 10.00    Total pack years: 15.00    Types: Cigarettes    Quit date: 2010    Years since quitting: 13.9   Smokeless tobacco: Never  Vaping Use   Vaping Use: Never used  Substance Use Topics   Alcohol use: Yes    Comment: very rare   Drug use: Never     Allergies   Milk-related compounds and Peanut-containing drug products   Review of Systems Review of Systems   Physical Exam Triage Vital Signs ED Triage Vitals  Enc Vitals Group  BP      Pulse      Resp      Temp      Temp src      SpO2      Weight      Height      Head Circumference      Peak Flow      Pain Score      Pain Loc      Pain Edu?      Excl. in GC?    No data found.  Updated Vital Signs There were no vitals taken for this visit.  Visual Acuity Right Eye Distance:   Left Eye Distance:   Bilateral Distance:    Right Eye Near:   Left Eye Near:    Bilateral Near:     Physical Exam Vitals reviewed.  Cardiovascular:     Rate and Rhythm: Normal rate and regular rhythm.     Pulses: Normal pulses.     Heart sounds: Normal heart sounds.  Pulmonary:     Breath sounds: Examination of the right-upper field reveals rhonchi. Rhonchi present.  Skin:    General: Skin is warm and dry.  Neurological:     General: No focal deficit present.     Mental Status: He is alert and oriented to person, place, and time.   Psychiatric:        Mood and Affect: Mood normal.        Behavior: Behavior normal.      UC Treatments / Results  Labs (all labs ordered are listed, but only abnormal results are displayed) Labs Reviewed - No data to display  EKG   Radiology No results found.  Procedures Procedures (including critical care time)  Medications Ordered in UC Medications - No data to display  Initial Impression / Assessment and Plan / UC Course  I have reviewed the triage vital signs and the nursing notes.  Pertinent labs & imaging results that were available during my care of the patient were reviewed by me and considered in my medical decision making (see chart for details).   Patient is afebrile here without recent antipyretics. Satting well on room air. Overall is well appearing, well hydrated, without respiratory distress. Pulmonary exam is remarkable for coarse breath sounds in the upper right lobe.   [place]ck[place] viral process but concern for possible secondary bacterial infection in his right upper lobe and will prescribe azithromycin.  Final Clinical Impressions(s) / UC Diagnoses   Final diagnoses:  None   Discharge Instructions   None    ED Prescriptions   None    PDMP not reviewed this encounter.   Charma Igo, [place] 07/07/22 1227

## 2022-11-13 ENCOUNTER — Ambulatory Visit (INDEPENDENT_AMBULATORY_CARE_PROVIDER_SITE_OTHER): Payer: BC Managed Care – PPO | Admitting: Nurse Practitioner

## 2022-11-13 ENCOUNTER — Encounter: Payer: Self-pay | Admitting: Nurse Practitioner

## 2022-11-13 VITALS — BP 132/80 | HR 66 | Temp 97.5°F | Ht 69.69 in | Wt 213.0 lb

## 2022-11-13 DIAGNOSIS — Z6831 Body mass index (BMI) 31.0-31.9, adult: Secondary | ICD-10-CM

## 2022-11-13 DIAGNOSIS — I10 Essential (primary) hypertension: Secondary | ICD-10-CM

## 2022-11-13 DIAGNOSIS — E782 Mixed hyperlipidemia: Secondary | ICD-10-CM | POA: Diagnosis not present

## 2022-11-13 DIAGNOSIS — N400 Enlarged prostate without lower urinary tract symptoms: Secondary | ICD-10-CM | POA: Diagnosis not present

## 2022-11-13 DIAGNOSIS — Z0000 Encounter for general adult medical examination without abnormal findings: Secondary | ICD-10-CM | POA: Diagnosis not present

## 2022-11-13 DIAGNOSIS — M1A0720 Idiopathic chronic gout, left ankle and foot, without tophus (tophi): Secondary | ICD-10-CM | POA: Diagnosis not present

## 2022-11-13 DIAGNOSIS — E6609 Other obesity due to excess calories: Secondary | ICD-10-CM

## 2022-11-13 MED ORDER — COLCHICINE 0.6 MG PO TABS: ORAL_TABLET | ORAL | 3 refills | Status: AC

## 2022-11-13 MED ORDER — AMLODIPINE BESYLATE 5 MG PO TABS: 5.0000 mg | ORAL_TABLET | Freq: Every day | ORAL | 4 refills | Status: DC

## 2022-11-13 NOTE — Patient Instructions (Signed)

## 2022-11-13 NOTE — Assessment & Plan Note (Signed)
Ongoing, stable.  Recheck lipid panel today and may need to restart medication.  Adjust as needed.

## 2022-11-13 NOTE — Progress Notes (Signed)
BP 132/80 (BP Location: Left Arm, Patient Position: Sitting, Cuff Size: Normal)   Pulse 66   Temp (!) 97.5 F (36.4 C) (Oral)   Ht 5' 9.69" (1.77 m)   Wt 213 lb (96.6 kg)   SpO2 97%   BMI 30.84 kg/m    Subjective:    Patient ID: Bryan Mccoy, male    DOB: 03/30/1971, 52 y.o.   MRN: [id number]  HPI: Bryan Mccoy is a 52 y.o. male presenting on 11/13/2022 for comprehensive medical examination. Current medical complaints include:none  He currently lives with: children Interim Problems from his last visit: no  HYPERTENSION / HYPERLIPIDEMIA Currently not taking Enalapril (stopped last visit) and Rosuvastatin as ran out of these months back. Taking Amlodipine only.  Has lost 10 pounds since last year, has been working on diet changes.  Has history of gout, last flare two weeks ago -- back of head and shoulder blade. Satisfied with current treatment? yes Duration of hypertension: chronic BP monitoring frequency: a few times a week BP range: 130-140/80 range BP medication side effects: no Duration of hyperlipidemia: chronic Aspirin: no Recent stressors: no Recurrent headaches: no Visual changes: no Palpitations: no Dyspnea: no Chest pain: no Lower extremity edema: no Dizzy/lightheaded: no   Functional Status Survey: Is the patient deaf or have difficulty hearing?: No Does the patient have difficulty seeing, even when wearing glasses/contacts?: No Does the patient have difficulty concentrating, remembering, or making decisions?: No Does the patient have difficulty walking or climbing stairs?: No Does the patient have difficulty dressing or bathing?: No Does the patient have difficulty doing errands alone such as visiting a doctor's office or shopping?: No     07/26/2020    9:08 AM 11/27/2021    9:54 AM 12/13/2021    6:24 PM 01/02/2022   11:24 AM 07/07/2022   11:52 AM  Fall Risk  Falls in the past year? 0 0  0   Was there an injury with Fall?  0  0   Fall Risk  Category Calculator  0  0   Fall Risk Category (Retired)  Low  Low   (RETIRED) Patient Fall Risk Level  Low fall risk Low fall risk  Low fall risk  Patient at Risk for Falls Due to  No Fall Risks  No Fall Risks   Fall risk Follow up  Falls evaluation completed  Falls evaluation completed        11/13/2022   11:07 AM 01/02/2022   11:24 AM 11/27/2021    9:54 AM 07/26/2020    9:09 AM 12/01/2019    9:57 AM  Depression screen PHQ 2/9  Decreased Interest 0 0 0 0 3  Down, Depressed, Hopeless 0 0 1 0 1  PHQ - 2 Score 0 0 1 0 4  Altered sleeping 0 0 1  1  Tired, decreased energy Change in appetite 0 0 1  1  Feeling bad or failure about yourself  0 0 1  0  Trouble concentrating 0 0 0  0  Moving slowly or fidgety/restless 0 0 0  0  Suicidal thoughts 0 0 0  0  PHQ-9 Score Difficult doing work/chores Not difficult at all Not difficult at all   Somewhat difficult       11/13/2022   11:07 AM 01/02/2022   11:24 AM 11/27/2021    9:54 AM 12/01/2019    9:59 AM  GAD 7 :  Generalized Anxiety Score  Nervous, Anxious, on Edge 0 0 1 0  Control/stop worrying 0 0 0 1  Worry too much - different things 0 0 0 1  Trouble relaxing 1 0 1 1  Restless 0 0 0 0  Easily annoyed or irritable 1 0 1 1  Afraid - awful might happen 0 0 0 1  Total GAD 7 Score 2 0 3 5  Anxiety Difficulty  Not difficult at all Somewhat difficult Somewhat difficult   Past Medical History:  Past Medical History:  Diagnosis Date   Allergy    Anxiety    Depression    Hypercholesteremia    Hypertension     Surgical History:  Past Surgical History:  Procedure Laterality Date   ANKLE FRACTURE SURGERY     right   APPENDECTOMY     COLONOSCOPY WITH PROPOFOL N/A 08/22/2020   Procedure: COLONOSCOPY WITH PROPOFOL;  Surgeon: Pasty Spillers, MD;  Location: [facility] ENDOSCOPY;  Service: Endoscopy;  Laterality: N/A;   FRACTURE SURGERY      Medications:  Current Outpatient Medications on File Prior to Visit  Medication  Sig   aspirin 81 MG EC tablet Take 81 mg by mouth daily. Swallow whole.   ibuprofen (ADVIL) 600 MG tablet Take 1 tablet (600 mg total) by mouth every 8 (eight) hours as needed.   No current facility-administered medications on file prior to visit.    Allergies:  Allergies  Allergen Reactions   Milk-Related Compounds    Peanut-Containing Drug Products     Social History:  Social History   Socioeconomic History   Marital status: Legally Separated    Spouse name: Not on file   Number of children: 2   Years of education: Not on file   Highest education level: Not on file  Occupational History   Not on file  Tobacco Use   Smoking status: Former    Packs/day: 1.50    Years: 10.00    Additional pack years: 0.00    Total pack years: 15.00    Types: Cigarettes    Quit date: 2010    Years since quitting: 14.3   Smokeless tobacco: Never  Vaping Use   Vaping Use: Never used  Substance and Sexual Activity   Alcohol use: Yes    Comment: very rare   Drug use: Never   Sexual activity: Yes  Other Topics Concern   Not on file  Social History Narrative   Not on file   Social Determinants of Health   Financial Resource Strain: Low Risk  (12/01/2019)   Overall Financial Resource Strain (CARDIA)    Difficulty of Paying Living Expenses: Not hard at all  Food Insecurity: No Food Insecurity (12/01/2019)   Hunger Vital Sign    Worried About Running Out of Food in the Last Year: Never true    Ran Out of Food in the Last Year: Never true  Transportation Needs: No Transportation Needs (12/01/2019)   PRAPARE - Administrator, Civil Service (Medical): No    Lack of Transportation (Non-Medical): No  Physical Activity: Insufficiently Active (12/01/2019)   Exercise Vital Sign    Days of Exercise per Week: 3 days    Minutes of Exercise per Session: 30 min  Stress: Stress Concern Present (12/01/2019)   [employer] of Occupational Health - Occupational Stress Questionnaire     Feeling of Stress : Rather much  Social Connections: Moderately Integrated (12/01/2019)   Social Connection and Isolation Panel [NHANES]  Frequency of Communication with Friends and Family: Three times a week    Frequency of Social Gatherings with Friends and Family: Three times a week    Attends Religious Services: More than 4 times per year    Active Member of Clubs or Organizations: No    Attends Banker Meetings: Never    Marital Status: Married  Catering manager Violence: Not on file   Social History   Tobacco Use  Smoking Status Former   Packs/day: 1.50   Years: 10.00   Additional pack years: 0.00   Total pack years: 15.00   Types: Cigarettes   Quit date: 2010   Years since quitting: 14.3  Smokeless Tobacco Never   Social History   Substance and Sexual Activity  Alcohol Use Yes   Comment: very rare    Family History:  Family History  Problem Relation Age of Onset   Hyperlipidemia Mother    Hypertension Mother    Migraines Sister    Hyperlipidemia Brother    Allergies Son    Lung cancer Maternal Grandfather    Diabetes Paternal Grandmother    Alzheimer's disease Paternal Grandfather     Past medical history, surgical history, medications, allergies, family history and social history reviewed with patient today and changes made to appropriate areas of the chart.   ROS All other ROS negative except what is listed above and in the HPI.      Objective:    BP 132/80 (BP Location: Left Arm, Patient Position: Sitting, Cuff Size: Normal)   Pulse 66   Temp (!) 97.5 F (36.4 C) (Oral)   Ht 5' 9.69" (1.77 m)   Wt 213 lb (96.6 kg)   SpO2 97%   BMI 30.84 kg/m   Wt Readings from Last 3 Encounters:  11/13/22 213 lb (96.6 kg)  01/02/22 221 lb (100.2 kg)  11/27/21 223 lb 6.4 oz (101.3 kg)   Physical Exam Vitals and nursing note reviewed.  Constitutional:      General: He is awake. He is not in acute distress.    Appearance: He is well-developed  and well-groomed. He is not ill-appearing or toxic-appearing.  HENT:     Head: Normocephalic and atraumatic.     Right Ear: Hearing, tympanic membrane, ear canal and external ear normal. No drainage.     Left Ear: Hearing, tympanic membrane, ear canal and external ear normal. No drainage.     Nose: Nose normal.     Mouth/Throat:     Pharynx: Uvula midline.  Eyes:     General: Lids are normal.        Right eye: No discharge.        Left eye: No discharge.     Extraocular Movements: Extraocular movements intact.     Conjunctiva/sclera: Conjunctivae normal.     Pupils: Pupils are equal, round, and reactive to light.     Visual Fields: Right eye visual fields normal and left eye visual fields normal.  Neck:     Thyroid: No thyromegaly.     Vascular: No carotid bruit or JVD.     Trachea: Trachea normal.  Cardiovascular:     Rate and Rhythm: Normal rate and regular rhythm.     Heart sounds: Normal heart sounds, S1 normal and S2 normal. No murmur heard.    No gallop.  Pulmonary:     Effort: Pulmonary effort is normal. No accessory muscle usage or respiratory distress.     Breath sounds: Normal breath sounds.  Abdominal:     General: Bowel sounds are normal.     Palpations: Abdomen is soft. There is no hepatomegaly or splenomegaly.     Tenderness: There is no abdominal tenderness.  Musculoskeletal:        General: Normal range of motion.     Cervical back: Normal range of motion and neck supple.     Right lower leg: No edema.     Left lower leg: No edema.  Lymphadenopathy:     Head:     Right side of head: No submental, submandibular, tonsillar, preauricular or posterior auricular adenopathy.     Left side of head: No submental, submandibular, tonsillar, preauricular or posterior auricular adenopathy.     Cervical: No cervical adenopathy.  Skin:    General: Skin is warm and dry.     Capillary Refill: Capillary refill takes less than 2 seconds.     Findings: No rash.   Neurological:     Mental Status: He is alert and oriented to person, place, and time.     Gait: Gait is intact.     Deep Tendon Reflexes: Reflexes are normal and symmetric.     Reflex Scores:      Brachioradialis reflexes are 2+ on the right side and 2+ on the left side.      Patellar reflexes are 2+ on the right side and 2+ on the left side. Psychiatric:        Attention and Perception: Attention normal.        Mood and Affect: Mood normal.        Speech: Speech normal.        Behavior: Behavior normal. Behavior is cooperative.        Thought Content: Thought content normal.        Cognition and Memory: Cognition normal.     Results for orders placed or performed in visit on 01/02/22  Chlamydia/Gonococcus/Trichomonas, NAA([facility])   Specimen: Urine   UR  Result Value Ref Range   Chlamydia by NAA Negative Negative   Gonococcus by NAA Negative Negative   Trich vag by NAA Negative Negative      Assessment & Plan:   Problem List Items Addressed This Visit       Cardiovascular and Mediastinum   Essential hypertension - Primary    Chronic, stable.  BP at goal in office and at home.  He has been losing weight, down 10 pounds, and focused on DASH diet.  At this time continue Amlodipine 5 MG daily, educated him on this medication and side effects to monitor for.  Recommend he monitor BP at least a few mornings a week at home and document.  DASH diet at home.  Labs today: CBC, CMP, TSH.  Refills sent in.  Can restart an ACE or ARB in future as needed.         Relevant Medications   amLODipine (NORVASC) 5 MG tablet   Other Relevant Orders   CBC with Differential/Platelet   Comprehensive metabolic panel   TSH     Other   Gout    Chronic, ongoing with recent flare -- will send in Colchicine refill.  Check uric acid level and if elevated may need to start Allopurinol.  Recommend heavy focus on diet at home.      Relevant Medications   colchicine 0.6 MG tablet   Other Relevant  Orders   Uric acid   Hyperlipidemia, mixed    Ongoing, stable.  Recheck lipid panel  today and may need to restart medication.  Adjust as needed.      Relevant Medications   amLODipine (NORVASC) 5 MG tablet   Other Relevant Orders   Comprehensive metabolic panel   Lipid Panel w/o Chol/HDL Ratio   Obesity    BMI 30.84.  Has lost 10 pounds since last visit, praised for this.  Recommended eating smaller high protein, low fat meals more frequently and exercising 30 mins a day 5 times a week with a goal of 10-15lb weight loss in the next 3 months. Patient voiced their understanding and motivation to adhere to these recommendations.       Other Visit Diagnoses     Benign prostatic hyperplasia without lower urinary tract symptoms       PSA on labs today.   Relevant Orders   PSA   Encounter for annual physical exam       Annual labs today and reviewed health maintenance.  Refuses return for colonoscopy at this time.        Discussed aspirin prophylaxis for myocardial infarction prevention and decision was it was not indicated  LABORATORY TESTING:  Health maintenance labs ordered today as discussed above.   The natural history of prostate cancer and ongoing controversy regarding screening and potential treatment outcomes of prostate cancer has been discussed with the patient. The meaning of a false positive PSA and a false negative PSA has been discussed. He indicates understanding of the limitations of this screening test and wishes to proceed with screening PSA testing.   IMMUNIZATIONS:   - Tdap: Tetanus vaccination status reviewed: last tetanus booster within 10 years. - Influenza: Refused - Pneumovax: Not applicable - Prevnar: Not applicable - Zostavax vaccine: Up to date  SCREENING: - Colonoscopy: wishes to wait until next year, was to repeat in one year with 2 day prep -- last 09/11/20  Discussed with patient purpose of the colonoscopy is to detect colon cancer at curable  precancerous or early stages   - AAA Screening: Not applicable  -Hearing Test: Not applicable  -Spirometry: Not applicable   PATIENT COUNSELING:    Sexuality: Discussed sexually transmitted diseases, partner selection, use of condoms, avoidance of unintended pregnancy  and contraceptive alternatives.   Advised to avoid cigarette smoking.  I discussed with the patient that most people either abstain from alcohol or drink within safe limits (<=14/week and <=4 drinks/occasion for males, <=7/weeks and <= 3 drinks/occasion for females) and that the risk for alcohol disorders and other health effects rises proportionally with the number of drinks per week and how often a drinker exceeds daily limits.  Discussed cessation/primary prevention of drug use and availability of treatment for abuse.   Diet: Encouraged to adjust caloric intake to maintain  or achieve ideal body weight, to reduce intake of dietary saturated fat and total fat, to limit sodium intake by avoiding high sodium foods and not adding table salt, and to maintain adequate dietary potassium and calcium preferably from fresh fruits, vegetables, and low-fat dairy products.    Stressed the importance of regular exercise  Injury prevention: Discussed safety belts, safety helmets, smoke detector, smoking near bedding or upholstery.   Dental health: Discussed importance of regular tooth brushing, flossing, and dental visits.   Follow up plan: NEXT PREVENTATIVE PHYSICAL DUE IN 1 YEAR. Return in about 1 year (around 11/13/2023) for Annual Exam.

## 2022-11-13 NOTE — Assessment & Plan Note (Signed)
Chronic, ongoing with recent flare -- will send in Colchicine refill.  Check uric acid level and if elevated may need to start Allopurinol.  Recommend heavy focus on diet at home.

## 2022-11-13 NOTE — Assessment & Plan Note (Signed)
BMI 30.84.  Has lost 10 pounds since last visit, praised for this.  Recommended eating smaller high protein, low fat meals more frequently and exercising 30 mins a day 5 times a week with a goal of 10-15lb weight loss in the next 3 months. Patient voiced their understanding and motivation to adhere to these recommendations.

## 2022-11-13 NOTE — Assessment & Plan Note (Signed)
Chronic, stable.  BP at goal in office and at home.  He has been losing weight, down 10 pounds, and focused on DASH diet.  At this time continue Amlodipine 5 MG daily, educated him on this medication and side effects to monitor for.  Recommend he monitor BP at least a few mornings a week at home and document.  DASH diet at home.  Labs today: CBC, CMP, TSH.  Refills sent in.  Can restart an ACE or ARB in future as needed.

## 2022-11-14 ENCOUNTER — Encounter: Payer: Self-pay | Admitting: Nurse Practitioner

## 2022-11-14 DIAGNOSIS — M1A0720 Idiopathic chronic gout, left ankle and foot, without tophus (tophi): Secondary | ICD-10-CM

## 2022-11-14 DIAGNOSIS — E782 Mixed hyperlipidemia: Secondary | ICD-10-CM

## 2022-11-14 LAB — CBC WITH DIFFERENTIAL/PLATELET
Basophils Absolute: 0.1 10*3/uL (ref 0.0–0.2)
Basos: 1 %
EOS (ABSOLUTE): 0.2 10*3/uL (ref 0.0–0.4)
Eos: 2 %
Hematocrit: 46 % (ref 37.5–51.0)
Hemoglobin: 15.4 g/dL (ref 13.0–17.7)
Immature Grans (Abs): 0 10*3/uL (ref 0.0–0.1)
Immature Granulocytes: 1 %
Lymphocytes Absolute: 1.8 10*3/uL (ref 0.7–3.1)
Lymphs: 29 %
MCH: 29.2 pg (ref 26.6–33.0)
MCHC: 33.5 g/dL (ref 31.5–35.7)
MCV: 87 fL (ref 79–97)
Monocytes Absolute: 0.4 10*3/uL (ref 0.1–0.9)
Monocytes: 6 %
Neutrophils Absolute: 3.9 10*3/uL (ref 1.4–7.0)
Neutrophils: 61 %
Platelets: 317 10*3/uL (ref 150–450)
RBC: 5.27 x10E6/uL (ref 4.14–5.80)
RDW: 13.4 % (ref 11.6–15.4)
WBC: 6.4 10*3/uL (ref 3.4–10.8)

## 2022-11-14 LAB — COMPREHENSIVE METABOLIC PANEL
ALT: 22 IU/L (ref 0–44)
AST: 29 IU/L (ref 0–40)
Albumin/Globulin Ratio: 1.9 (ref 1.2–2.2)
Albumin: 5 g/dL — ABNORMAL HIGH (ref 3.8–4.9)
Alkaline Phosphatase: 107 IU/L (ref 44–121)
BUN/Creatinine Ratio: 12 (ref 9–20)
BUN: 13 mg/dL (ref 6–24)
Bilirubin Total: 0.4 mg/dL (ref 0.0–1.2)
CO2: 22 mmol/L (ref 20–29)
Calcium: 9.5 mg/dL (ref 8.7–10.2)
Chloride: 100 mmol/L (ref 96–106)
Creatinine, Ser: 1.08 mg/dL (ref 0.76–1.27)
Globulin, Total: 2.7 g/dL (ref 1.5–4.5)
Glucose: 83 mg/dL (ref 70–99)
Potassium: 4.2 mmol/L (ref 3.5–5.2)
Sodium: 139 mmol/L (ref 134–144)
Total Protein: 7.7 g/dL (ref 6.0–8.5)
eGFR: 83 mL/min/{1.73_m2} (ref >59)

## 2022-11-14 LAB — URIC ACID: Uric Acid: 7.8 mg/dL (ref 3.8–8.4)

## 2022-11-14 LAB — LIPID PANEL W/O CHOL/HDL RATIO
Cholesterol, Total: 238 mg/dL — ABNORMAL HIGH (ref 100–199)
HDL: 42 mg/dL (ref >39)
LDL Chol Calc (NIH): 146 mg/dL — ABNORMAL HIGH (ref 0–99)
Triglycerides: 275 mg/dL — ABNORMAL HIGH (ref 0–149)
VLDL Cholesterol Cal: 50 mg/dL — ABNORMAL HIGH (ref 5–40)

## 2022-11-14 LAB — TSH: TSH: 2 u[IU]/mL (ref 0.450–4.500)

## 2022-11-14 LAB — PSA: Prostate Specific Ag, Serum: 0.4 ng/mL (ref 0.0–4.0)

## 2022-11-14 NOTE — Progress Notes (Signed)
Contacted via MyChart The 10-year ASCVD risk score (Arnett DK, et al., 2019) is: 7.1%   Values used to calculate the score:     Age: 52 years     Sex: Male     Is Non-Hispanic African American: No     Diabetic: No     Tobacco smoker: No     Systolic Blood Pressure: 132 mmHg     Is BP treated: Yes     HDL Cholesterol: 42 mg/dL     Total Cholesterol: 238 mg/dL   Good evening Bryan Mccoy your labs have returned: - Kidney function, creatinine and eGFR, remains normal, as is liver function, AST and ALT.  - CBC shows no anemia or infection. Thyroid level, TSH, normal. - Prostate lab within normal range. - Uric is above goal for gout patients at 7.8, would like to see below 6.  You would benefit from taking Allopurinol 100 MG daily to help lower of uric acid and prevent gout.  Do you want to try this? - Your cholesterol levels remain elevated.  I do recommend restarting your Rosuvastatin daily.  Are you okay with restarting this to help lower levels and prevent stroke?  Please let me know answers by sending me a MyChart message.  Thank you.  Any questions? Keep being awesome!!  Thank you for allowing me to participate in your care.  I appreciate you. Kindest regards, Bryan Mccoy

## 2022-11-17 MED ORDER — ALLOPURINOL 100 MG PO TABS: 100.0000 mg | ORAL_TABLET | Freq: Every day | ORAL | 2 refills | Status: DC

## 2022-11-17 MED ORDER — ROSUVASTATIN CALCIUM 10 MG PO TABS: 10.0000 mg | ORAL_TABLET | Freq: Every day | ORAL | 3 refills | Status: DC

## 2022-11-17 NOTE — Addendum Note (Signed)
Addended by: Aura Dials T on: 11/17/2022 12:02 PM   Modules accepted: Orders

## 2022-11-17 NOTE — Telephone Encounter (Signed)
Called and scheduled patient for 01/12/2023 @ 10:00 for lab only visit.

## 2022-11-21 ENCOUNTER — Encounter: Payer: Self-pay | Admitting: Nurse Practitioner

## 2022-11-30 NOTE — Patient Instructions (Incomplete)
IMAGING LOCATION: [address], [place], [place] [address] [phone]   Acute Back Pain, Adult Acute back pain is sudden and usually short-lived. It is often caused by an injury to the muscles and tissues in the back. The injury may result from: A muscle, tendon, or ligament getting overstretched or torn. Ligaments are tissues that connect bones to each other. Lifting something improperly can cause a back strain. Wear and tear (degeneration) of the spinal disks. Spinal disks are circular tissue that provide cushioning between the bones of the spine (vertebrae). Twisting motions, such as while playing sports or doing yard work. A hit to the back. Arthritis. You may have a physical exam, lab tests, and imaging tests to find the cause of your pain. Acute back pain usually goes away with rest and home care. Follow these instructions at home: Managing pain, stiffness, and swelling Take over-the-counter and prescription medicines only as told by your health care provider. Treatment may include medicines for pain and inflammation that are taken by mouth or applied to the skin, or muscle relaxants. Your health care provider may recommend applying ice during the first 24-48 hours after your pain starts. To do this: Put ice in a plastic bag. Place a towel between your skin and the bag. Leave the ice on for 20 minutes, 2-3 times a day. Remove the ice if your skin turns bright red. This is very important. If you cannot feel pain, heat, or cold, you have a greater risk of damage to the area. If directed, apply heat to the affected area as often as told by your health care provider. Use the heat source that your health care provider recommends, such as a moist heat pack or a heating pad. Place a towel between your skin and the heat source. Leave the heat on for 20-30 minutes. Remove the heat if your skin turns bright red. This is especially important if you are unable to feel pain, heat, or  cold. You have a greater risk of getting burned. Activity  Do not stay in bed. Staying in bed for more than 1-2 days can delay your recovery. Sit up and stand up straight. Avoid leaning forward when you sit or hunching over when you stand. If you work at a desk, sit close to it so you do not need to lean over. Keep your chin tucked in. Keep your neck drawn back, and keep your elbows bent at a 90-degree angle (right angle). Sit high and close to the steering wheel when you drive. Add lower back (lumbar) support to your car seat, if needed. Take short walks on even surfaces as soon as you are able. Try to increase the length of time you walk each day. Do not sit, drive, or stand in one place for more than 30 minutes at a time. Sitting or standing for long periods of time can put stress on your back. Do not drive or use heavy machinery while taking prescription pain medicine. Use proper lifting techniques. When you bend and lift, use positions that put less stress on your back: Bend your knees. Keep the load close to your body. Avoid twisting. Exercise regularly as told by your health care provider. Exercising helps your back heal faster and helps prevent back injuries by keeping muscles strong and flexible. Work with a physical therapist to make a safe exercise program, as recommended by your health care provider. Do any exercises as told by your physical therapist. Lifestyle Maintain a healthy weight.   Extra weight puts stress on your back and makes it difficult to have good posture. Avoid activities or situations that make you feel anxious or stressed. Stress and anxiety increase muscle tension and can make back pain worse. Learn ways to manage anxiety and stress, such as through exercise. General instructions Sleep on a firm mattress in a comfortable position. Try lying on your side with your knees slightly bent. If you lie on your back, put a pillow under your knees. Keep your head and neck in  a straight line with your spine (neutral position) when using electronic equipment like smartphones or pads. To do this: Raise your smartphone or pad to look at it instead of bending your head or neck to look down. Put the smartphone or pad at the level of your face while looking at the screen. Follow your treatment plan as told by your health care provider. This may include: Cognitive or behavioral therapy. Acupuncture or massage therapy. Meditation or yoga. Contact a health care provider if: You have pain that is not relieved with rest or medicine. You have increasing pain going down into your legs or buttocks. Your pain does not improve after 2 weeks. You have pain at night. You lose weight without trying. You have a fever or chills. You develop nausea or vomiting. You develop abdominal pain. Get help right away if: You develop new bowel or bladder control problems. You have unusual weakness or numbness in your arms or legs. You feel faint. These symptoms may represent a serious problem that is an emergency. Do not wait to see if the symptoms will go away. Get medical help right away. Call your local emergency services (911 in the [place] Do not drive yourself to the hospital. Summary Acute back pain is sudden and usually short-lived. Use proper lifting techniques. When you bend and lift, use positions that put less stress on your back. Take over-the-counter and prescription medicines only as told by your health care provider, and apply heat or ice as told. This information is not intended to replace advice given to you by your health care provider. Make sure you discuss any questions you have with your health care provider. Document Revised: 09/28/2020 Document Reviewed: 09/28/2020 Elsevier Patient Education  2023 [employer]  

## 2022-12-03 ENCOUNTER — Ambulatory Visit (INDEPENDENT_AMBULATORY_CARE_PROVIDER_SITE_OTHER): Payer: BC Managed Care – PPO | Admitting: Nurse Practitioner

## 2022-12-03 ENCOUNTER — Encounter: Payer: Self-pay | Admitting: Nurse Practitioner

## 2022-12-03 ENCOUNTER — Telehealth: Payer: Self-pay

## 2022-12-03 ENCOUNTER — Ambulatory Visit
Admission: RE | Admit: 2022-12-03 | Discharge: 2022-12-03 | Disposition: A | Discharge status: Home / Self Care | Payer: BC Managed Care – PPO | Source: Ambulatory Visit | Attending: Nurse Practitioner | Admitting: Nurse Practitioner

## 2022-12-03 ENCOUNTER — Ambulatory Visit
Admission: RE | Admit: 2022-12-03 | Discharge: 2022-12-03 | Disposition: A | Discharge status: Home / Self Care | Payer: BC Managed Care – PPO | Attending: Nurse Practitioner | Admitting: Nurse Practitioner

## 2022-12-03 VITALS — BP 135/89 | HR 64 | Temp 97.8°F | Ht 69.69 in | Wt 213.8 lb

## 2022-12-03 DIAGNOSIS — G8929 Other chronic pain: Secondary | ICD-10-CM

## 2022-12-03 DIAGNOSIS — M546 Pain in thoracic spine: Secondary | ICD-10-CM

## 2022-12-03 LAB — URINALYSIS, ROUTINE W REFLEX MICROSCOPIC
Bilirubin, UA: NEGATIVE
Glucose, UA: NEGATIVE
Ketones, UA: NEGATIVE
Leukocytes,UA: NEGATIVE
Nitrite, UA: NEGATIVE
RBC, UA: NEGATIVE
Specific Gravity, UA: <1.005 — ABNORMAL LOW (ref 1.005–1.030)
Urobilinogen, Ur: 0.2 mg/dL (ref 0.2–1.0)
pH, UA: 5.5 (ref 5.0–7.5)

## 2022-12-03 MED ORDER — METHOCARBAMOL 750 MG PO TABS: 750.0000 mg | ORAL_TABLET | Freq: Three times a day (TID) | ORAL | 0 refills | Status: AC | PRN

## 2022-12-03 MED ORDER — LIDOCAINE 5 % EX PTCH: 1.0000 | MEDICATED_PATCH | CUTANEOUS | 0 refills | Status: DC

## 2022-12-03 NOTE — Progress Notes (Signed)
BP 135/89   Pulse 64   Temp 97.8 F (36.6 C) (Oral)   Ht 5' 9.69" (1.77 m)   Wt 213 lb 12.8 oz (97 kg)   SpO2 96%   BMI 30.96 kg/m    Subjective:    Patient ID: Bryan Mccoy, male    DOB: [birth year], 52 y.o.   MRN: [id number]  HPI: Bryan Mccoy is a 52 y.o. male  Chief Complaint  Patient presents with   Back Pain    Just below the right shoulder blade, has been for the past few months it has been getting worse.   BACK PAIN Has been dealing with this pain off and on for 14 years. Presents just below right shoulder blade.  Has been getting worse over past 3 months, more frequent.  He is concerned for digestive issues -- no N&V.  Does have underlying constipation at baseline, drinks lots of water and eats foods high in fiber.  Recent labs reassuring. Due for repeat colonoscopy, plans on obtaining next year.    When pain initially presented had no injuries.   Duration:  chronic with worsening over past few months Mechanism of injury: unknown Location: Right and midline thoracic Onset: gradual Severity: 8/10 at worst, today 4-5/10 -- when not hurting it itches to area Quality: sharp, aching, and throbbing Frequency: intermittent Radiation: none Aggravating factors: unknown -- movement does not aggravate it -- notices it with eating eggs or gluten Alleviating factors: nothing Status: fluctuating Treatments attempted: diet changes  Relief with NSAIDs?: sometimes offers relief Nighttime pain:  no Paresthesias / decreased sensation:  no Bowel / bladder incontinence:  no Fevers:  no Dysuria / urinary frequency:  no   Relevant past medical, surgical, family and social history reviewed and updated as indicated. Interim medical history since our last visit reviewed. Allergies and medications reviewed and updated.  Review of Systems  Constitutional:  Negative for activity change, diaphoresis, fatigue and fever.  Respiratory:  Negative for cough, chest tightness, shortness  of breath and wheezing.   Cardiovascular:  Negative for chest pain, palpitations and leg swelling.  Gastrointestinal: Negative.   Musculoskeletal:  Positive for arthralgias and back pain.  Neurological: Negative.   Psychiatric/Behavioral: Negative.     Per HPI unless specifically indicated above     Objective:    BP 135/89   Pulse 64   Temp 97.8 F (36.6 C) (Oral)   Ht 5' 9.69" (1.77 m)   Wt 213 lb 12.8 oz (97 kg)   SpO2 96%   BMI 30.96 kg/m   Wt Readings from Last 3 Encounters:  12/03/22 213 lb 12.8 oz (97 kg)  11/13/22 213 lb (96.6 kg)  01/02/22 221 lb (100.2 kg)    Physical Exam Vitals and nursing note reviewed.  Constitutional:      General: He is awake. He is not in acute distress.    Appearance: He is well-developed and well-groomed. He is obese. He is not ill-appearing or toxic-appearing.  HENT:     Head: Normocephalic.     Right Ear: Hearing and external ear normal.     Left Ear: Hearing and external ear normal.  Eyes:     General: Lids are normal.     Extraocular Movements: Extraocular movements intact.     Conjunctiva/sclera: Conjunctivae normal.  Neck:     Thyroid: No thyromegaly.     Vascular: No carotid bruit.  Cardiovascular:     Rate and Rhythm: Normal rate and regular rhythm.  Heart sounds: Normal heart sounds. No murmur heard.    No gallop.  Pulmonary:     Effort: Pulmonary effort is normal. No accessory muscle usage or respiratory distress.     Breath sounds: Normal breath sounds.  Abdominal:     General: Bowel sounds are normal. There is no distension.     Palpations: Abdomen is soft.     Tenderness: There is no abdominal tenderness.  Musculoskeletal:     Cervical back: Full passive range of motion without pain.     Thoracic back: Tenderness (under right shoulder blade area) present. No swelling, deformity, spasms or bony tenderness. Normal range of motion.     Right lower leg: No edema.     Left lower leg: No edema.     Comments:  Tenderness noted with rotation right or lateral movement right.  Lymphadenopathy:     Cervical: No cervical adenopathy.  Skin:    General: Skin is warm.     Capillary Refill: Capillary refill takes less than 2 seconds.  Neurological:     Mental Status: He is alert and oriented to person, place, and time.     Deep Tendon Reflexes: Reflexes are normal and symmetric.     Reflex Scores:      Brachioradialis reflexes are 2+ on the right side and 2+ on the left side.      Patellar reflexes are 2+ on the right side and 2+ on the left side. Psychiatric:        Attention and Perception: Attention normal.        Mood and Affect: Mood normal.        Speech: Speech normal.        Behavior: Behavior normal. Behavior is cooperative.        Thought Content: Thought content normal.    Results for orders placed or performed in visit on 12/03/22  Urinalysis, Routine w reflex microscopic  Result Value Ref Range   Specific Gravity, UA <1.005 (L) 1.005 - 1.030   pH, UA 5.5 5.0 - 7.5   Color, UA Yellow Yellow   Appearance Ur Clear Clear   Leukocytes,UA Negative Negative   Protein,UA Trace (A) Negative/Trace   Glucose, UA Negative Negative   Ketones, UA Negative Negative   RBC, UA Negative Negative   Bilirubin, UA Negative Negative   Urobilinogen, Ur 0.2 0.2 - 1.0 mg/dL   Nitrite, UA Negative Negative   Microscopic Examination Comment       Assessment & Plan:   Problem List Items Addressed This Visit       Other   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  ?musculoskeletal versus GI related -- recent labs were reassuring for liver and gall bladder testing.  Will obtain labs today to include UA, egg allergy, Celiac testing, basic food panel.  Obtain imaging of thoracic spine to further assess for any arthritic changes.  Trial Robaxin as needed and Lidocaine patches, see if benefit to this.  Return in 6 weeks for follow-up.      Relevant  Medications   methocarbamol (ROBAXIN-750) 750 MG tablet   Other Relevant Orders   Celiac Disease Panel   F245-IgE Egg, Whole   Allergen Profile, Basic Food ([facility])   Urinalysis, Routine w reflex microscopic (Completed)   DG Thoracic Spine W/Swimmers     Follow up plan: Return in about 6 weeks (around 01/12/2023) for Back follow-up -- has lab visit that day, add  on office visit.

## 2022-12-03 NOTE — Progress Notes (Signed)
Contacted via MyChart   Good evening Bryan Mccoy, your imaging has returned: - There are small calcified granulomas (small cluster of white blood cells and other tissues) in the spleen area - sometimes these can be seen with past history of tuberculosis or histoplasmosis -- have you ever had these infections? If not then this is something we would continue to monitor as they are small and labs have been stable, do not suspect these are related to your pain. - There is mild concavity (curve inward) of upper thoracic spine which has not changed since 2010, but could be possible relation to your pain if curvature is more inward.  Could cause some muscle strain or discomfort.  Try the muscle relaxer and Lidocaine patches to see if any benefit.  We will see what labs show as well.  Any questions?

## 2022-12-03 NOTE — Assessment & Plan Note (Addendum)
Chronic, ongoing with current worsening.  ?musculoskeletal versus GI related -- recent labs were reassuring for liver and gall bladder testing.  Will obtain labs today to include UA, egg allergy, Celiac testing, basic food panel.  Obtain imaging of thoracic spine to further assess for any arthritic changes.  Trial Robaxin as needed and Lidocaine patches, see if benefit to this.  Return in 6 weeks for follow-up.

## 2022-12-03 NOTE — Progress Notes (Signed)
Contacted via MyChart   Good evening Bryan Mccoy, urine sample shows no blood concerning for kidney stones.  There is a trace of protein which we sometimes see with high blood pressure and will monitor.  Recommend continue good hydration.  Any questions?

## 2022-12-03 NOTE — Telephone Encounter (Signed)
PA started for Lidocaine 5% patches through [employer]. Awaiting on determination  

## 2022-12-04 LAB — ALLERGENS(14)

## 2022-12-05 LAB — ALLERGENS(14)

## 2022-12-05 LAB — CELIAC DISEASE PANEL
Endomysial IgA: NEGATIVE
Transglutaminase IgA: <2 U/mL (ref 0–3)

## 2022-12-05 NOTE — Progress Notes (Signed)
Contacted via MyChart   Celiac testing is negative. Waiting on allergy testing.

## 2022-12-06 ENCOUNTER — Ambulatory Visit
Admission: RE | Admit: 2022-12-06 | Discharge: 2022-12-06 | Disposition: A | Discharge status: Home / Self Care | Payer: BC Managed Care – PPO | Source: Ambulatory Visit | Attending: Emergency Medicine | Admitting: Emergency Medicine

## 2022-12-06 VITALS — BP 134/87 | HR 76 | Temp 99.0°F | Resp 16 | Ht 69.0 in | Wt 213.0 lb

## 2022-12-06 DIAGNOSIS — H6123 Impacted cerumen, bilateral: Secondary | ICD-10-CM | POA: Diagnosis not present

## 2022-12-06 LAB — ALLERGENS(14)
Beef IgE: <0.1 kU/L
Milk IgE: <0.1 kU/L
Peanut IgE: <0.1 kU/L
Pork IgE: <0.1 kU/L
Shrimp IgE: <0.1 kU/L
Soybean IgE: <0.1 kU/L
Tuna: <0.1 kU/L

## 2022-12-06 LAB — CELIAC DISEASE PANEL: IgA/Immunoglobulin A, Serum: 174 mg/dL (ref 90–386)

## 2022-12-06 LAB — F245-IGE EGG, WHOLE: Egg, Whole IgE: <0.1 kU/L

## 2022-12-06 NOTE — ED Triage Notes (Addendum)
Patient in office today c/o right ear clog symptoms started yesterday.   OTC: ear wax removal  Denies: dizziness and fever

## 2022-12-06 NOTE — Progress Notes (Signed)
Contacted via MyChart   Overall allergy testing is all negative:)

## 2022-12-06 NOTE — ED Provider Notes (Signed)
Bryan Mccoy    CSN: [id number] Arrival date & time: 12/06/22  1233      History   Chief Complaint Chief Complaint  Patient presents with   Ear Fullness    Right ear is clogged up - Entered by patient    HPI Bryan Mccoy is a 52 y.o. male.  Patient presents with bilateral ear fullness and decreased hearing, worse in the right ear.  Treatment attempted with OTC earwax drops without relief.  He denies fever, chills, ear drainage, sore throat, cough, or other symptoms.  His medical history includes hypertension.  The history is provided by the patient and medical records.    Past Medical History:  Diagnosis Date   Allergy    Anxiety    Depression    Hypercholesteremia    Hypertension     Patient Active Problem List   Diagnosis Date Noted   Chronic right-sided thoracic back pain 12/03/2022   Polyp of colon    Obesity 07/26/2020   Gout 06/26/2020   Hyperlipidemia, mixed 12/25/2019   Essential hypertension 12/01/2019    Past Surgical History:  Procedure Laterality Date   ANKLE FRACTURE SURGERY     right   APPENDECTOMY     COLONOSCOPY WITH PROPOFOL N/A 08/22/2020   Procedure: COLONOSCOPY WITH PROPOFOL;  Surgeon: Pasty Spillers, MD;  Location: [facility] ENDOSCOPY;  Service: Endoscopy;  Laterality: N/A;   FRACTURE SURGERY         Home Medications    Prior to Admission medications   Medication Sig Start Date End Date Taking? Authorizing Provider  allopurinol (ZYLOPRIM) 100 MG tablet Take 1 tablet (100 mg total) by mouth daily. 11/17/22   Cannady, Corrie Dandy T, NP  amLODipine (NORVASC) 5 MG tablet Take 1 tablet (5 mg total) by mouth daily. 11/13/22   Aura Dials T, NP  aspirin 81 MG EC tablet Take 81 mg by mouth daily. Swallow whole.    [provider]  colchicine 0.6 MG tablet Take 2 tablets immediately, then 1 tablet twice daily for up to 7 days max. Discontinue if you develop stomach pains or diarrhea. 11/13/22   Cannady, Corrie Dandy T, NP  ibuprofen  (ADVIL) 600 MG tablet Take 1 tablet (600 mg total) by mouth every 8 (eight) hours as needed. 10/15/21   Freddy Finner, NP  lidocaine (LIDODERM) 5 % Place 1 patch onto the skin daily. Remove & Discard patch within 12 hours or as directed by MD 12/03/22   Marjie Skiff, NP  methocarbamol (ROBAXIN-750) 750 MG tablet Take 1 tablet (750 mg total) by mouth every 8 (eight) hours as needed for muscle spasms. 12/03/22   Cannady, Corrie Dandy T, NP  rosuvastatin (CRESTOR) 10 MG tablet Take 1 tablet (10 mg total) by mouth daily. 11/17/22   Marjie Skiff, NP    Family History Family History  Problem Relation Age of Onset   Hyperlipidemia Mother    Hypertension Mother    Migraines Sister    Hyperlipidemia Brother    Allergies Son    Lung cancer Maternal Grandfather    Diabetes Paternal Grandmother    Alzheimer's disease Paternal Grandfather     Social History Social History   Tobacco Use   Smoking status: Former    Packs/day: 1.50    Years: 10.00    Additional pack years: 0.00    Total pack years: 15.00    Types: Cigarettes    Quit date: 2010    Years since quitting: 14.3  Smokeless tobacco: Never  Vaping Use   Vaping Use: Never used  Substance Use Topics   Alcohol use: Yes    Comment: very rare   Drug use: Never     Allergies   Milk-related compounds and Peanut-containing drug products   Review of Systems Review of Systems  Constitutional:  Negative for chills and fever.  HENT:  Positive for ear pain and hearing loss. Negative for ear discharge and sore throat.   Respiratory:  Negative for cough and shortness of breath.   Cardiovascular:  Negative for chest pain and palpitations.     Physical Exam Triage Vital Signs ED Triage Vitals  Enc Vitals Group     BP      Pulse      Resp      Temp      Temp src      SpO2      Weight      Height      Head Circumference      Peak Flow      Pain Score      Pain Loc      Pain Edu?      Excl. in GC?    No data  found.  Updated Vital Signs BP 134/87 (BP Location: Left Arm)   Pulse 76   Temp 99 F (37.2 C) (Oral)   Resp 16   Ht 5\' 9"  (1.753 m)   Wt 213 lb (96.6 kg)   SpO2 96%   BMI 31.45 kg/m   Visual Acuity Right Eye Distance:   Left Eye Distance:   Bilateral Distance:    Right Eye Near:   Left Eye Near:    Bilateral Near:     Physical Exam Vitals and nursing note reviewed.  Constitutional:      General: He is not in acute distress.    Appearance: Normal appearance. He is well-developed. He is not ill-appearing.  HENT:     Right Ear: There is impacted cerumen.     Left Ear: There is impacted cerumen.     Nose: Nose normal.     Mouth/Throat:     Mouth: Mucous membranes are moist.     Pharynx: Oropharynx is clear.  Cardiovascular:     Rate and Rhythm: Normal rate and regular rhythm.     Heart sounds: Normal heart sounds.  Pulmonary:     Effort: Pulmonary effort is normal. No respiratory distress.     Breath sounds: Normal breath sounds.  Musculoskeletal:     Cervical back: Neck supple.  Skin:    General: Skin is warm and dry.  Neurological:     Mental Status: He is alert.  Psychiatric:        Mood and Affect: Mood normal.        Behavior: Behavior normal.      UC Treatments / Results  Labs (all labs ordered are listed, but only abnormal results are displayed) Labs Reviewed - No data to display  EKG   Radiology No results found.  Procedures Procedures (including critical care time)  Medications Ordered in UC Medications - No data to display  Initial Impression / Assessment and Plan / UC Course  I have reviewed the triage vital signs and the nursing notes.  Pertinent labs & imaging results that were available during my care of the patient were reviewed by me and considered in my medical decision making (see chart for details).    Bilateral cerumen impaction.  Cerumen  removed via irrigation.  TMs noted to be clear after cerumen removal.  Patient reports  good relief of his symptoms.  Instructed him to follow-up with his PCP as needed.  Education provided on earwax buildup.  He agrees to plan of care.  Final Clinical Impressions(s) / UC Diagnoses   Final diagnoses:  Bilateral impacted cerumen     Discharge Instructions      Follow up with your primary care provider if your symptoms are not improving.        ED Prescriptions   None    PDMP not reviewed this encounter.   Mickie Bail, NP 12/06/22 [phone]

## 2022-12-06 NOTE — Discharge Instructions (Addendum)
Follow up with your primary care provider if your symptoms are not improving.     

## 2023-01-11 NOTE — Patient Instructions (Signed)
Managing Chronic Back Pain Chronic back pain is pain that lasts longer than 3 months. It often affects the lower back. It may feel like a muscle ache or a sharp, stabbing pain. It can be mild, moderate, or severe. There are things you can do to help manage your pain. See what works best for you. Your health care provider may also give you other instructions. What actions can I take to manage my chronic back pain? You may be given a treatment plan by your provider. Treatment often starts with rest and pain relief. It may also include: Physical therapy. These are exercises to help restore movement and strength to your back. Techniques to help you relax. Counseling or therapy. Cognitive behavioral therapy (CBT) is a form of therapy that helps you set goals and make changes. Acupuncture or massage therapy. Local electrical stimulation. Injections. You may be given medicines to numb an area or relieve pain. If other treatments do not help, you may need surgery. How to use body mechanics and posture to help with pain You can help relieve stress on your back with good posture and healthy body mechanics. Body mechanics are all the ways your body moves during the day. Posture is part of body mechanics. Good posture means: Your spine is in its correct S-curve, or neutral, position. Your shoulders are pulled back a bit. Your head is not tipped forward. To improve your posture and body mechanics, follow these guidelines. Standing  When standing, keep your feet about hip-width apart. Keep your knees slightly bent. Your ears, shoulders, and hips should line up. Your spine should be neutral. When you stand in one place for a long time, place one foot on a stable object that is 2-4 inches (5-10 cm) high, such as a footstool. Sitting  When sitting, keep your feet flat on the floor. Use a footrest, if needed. Keep your thighs parallel to the floor. Try not to round your shoulders or tilt your head  forward. When working at a desk or a computer: Position your desk so your hands are a little lower than your elbows. Slide your chair under your desk so you are close enough to have good posture. Position your monitor so you are looking straight ahead and do not have to tilt your head to view the screen. Lifting  Keep your feet shoulder-width apart. Tighten the muscles of your abdomen. Bend your knees and hips. Keep your spine neutral. Lift using the strength of your legs, not your back. Do not lock your knees straight out. Ask for help to lift heavy or awkward objects. Resting  Do not lie down in a way that causes pain. If you have pain when you sit, bend, stoop, or squat, lie in a way that your body does not bend much. Try not to curl up on your side with your arms and knees near your chest (fetal position). If it hurts to stand for a long time or reach with your arms, lie with your spine neutral and knees bent slightly. Try lying: On your side with a pillow between your knees. On your back with a pillow under your knees. How to recognize changes in your chronic back pain Let your provider know if your pain gets worse or does not get better with treatment. Your back pain may be getting worse if you have pain that: Starts to cause problems with your posture. Gets worse when you sit, stand, walk, bend, or lift things. Happens when you are active,  at rest, or both. Makes it hard for you to move around (limits mobility). Occurs with fever, weight loss, or trouble peeing (urinating). Causes numbness and tingling. Follow these instructions at home: Medicines You may need to take medicines for pain and inflammation. These may be taken by mouth or put on the skin. You may also be given muscle relaxants. Take over-the-counter and prescription medicines only as told by your provider. Ask your provider if the medicine prescribed to you: Requires you to avoid driving or using machinery. Can  cause constipation. You may need to take these actions to prevent or treat constipation: Drink enough fluid to keep your pee (urine) pale yellow. Take over-the-counter or prescription medicines. Eat foods that are high in fiber, such as beans, whole grains, and fresh fruits and vegetables. Limit foods that are high in fat and processed sugars, such as fried or sweet foods. Lifestyle Do not use any products that contain nicotine or tobacco. These products include cigarettes, chewing tobacco, and vaping devices, such as e-cigarettes. If you need help quitting, ask your provider. Eat a healthy diet. Eat lots of vegetables, fruits, fish, and lean meats. Work with your provider to stay at a healthy weight. General instructions Get regular exercise as told. Exercise can help with flexibility and strength. If physical therapy was prescribed, do exercises as told by your provider. Use ice or heat therapy as told by your provider. Where can I get support? Think about joining a support group for people with chronic back pain. You can find some groups at: Pain Connection Program: painconnection.org The American Chronic Pain Association: acpanow.com Contact a health care provider if: Your pain does not get better with rest or medicine. You have new pain. You have a fever. You lose weight quickly. You have trouble doing your normal activities. You feel weak or numb in one or both of your legs or feet. Get help right away if: You are not able to control when you pee or poop. You have severe back pain and: Nausea or vomiting. Pain in your chest or abdomen. Shortness of breath. You faint. These symptoms may be an emergency. Get help right away. Call 911. Do not wait to see if the symptoms will go away. Do not drive yourself to the hospital. This information is not intended to replace advice given to you by your health care provider. Make sure you discuss any questions you have with your health care  provider. Document Revised: 02/24/2022 Document Reviewed: 02/24/2022 Elsevier Patient Education  2024 [employer].

## 2023-01-12 ENCOUNTER — Ambulatory Visit (INDEPENDENT_AMBULATORY_CARE_PROVIDER_SITE_OTHER): Payer: BC Managed Care – PPO | Admitting: Nurse Practitioner

## 2023-01-12 ENCOUNTER — Encounter: Payer: Self-pay | Admitting: Nurse Practitioner

## 2023-01-12 ENCOUNTER — Other Ambulatory Visit: Payer: BC Managed Care – PPO

## 2023-01-12 VITALS — BP 127/85 | HR 68 | Temp 98.0°F | Ht 69.69 in | Wt 212.2 lb

## 2023-01-12 DIAGNOSIS — E782 Mixed hyperlipidemia: Secondary | ICD-10-CM | POA: Diagnosis not present

## 2023-01-12 DIAGNOSIS — G8929 Other chronic pain: Secondary | ICD-10-CM

## 2023-01-12 DIAGNOSIS — M546 Pain in thoracic spine: Secondary | ICD-10-CM | POA: Diagnosis not present

## 2023-01-12 MED ORDER — ROSUVASTATIN CALCIUM 10 MG PO TABS: 10.0000 mg | ORAL_TABLET | Freq: Every day | ORAL | 4 refills | Status: DC

## 2023-01-12 NOTE — Assessment & Plan Note (Signed)
Ongoing, stable.  Recheck lipid panel today.  Continue current medication regimen and adjust as needed.

## 2023-01-12 NOTE — Progress Notes (Signed)
BP 127/85   Pulse 68   Temp 98 F (36.7 C) (Oral)   Ht 5' 9.69" (1.77 m)   Wt 212 lb 3.2 oz (96.3 kg)   SpO2 98%   BMI 30.72 kg/m    Subjective:    Patient ID: Bryan Mccoy, male    DOB: 11-Jul-1971, 52 y.o.   MRN: [id number]  HPI: Bryan Mccoy is a 52 y.o. male  Chief Complaint  Patient presents with   Back Pain    Patient states that back pain is much improved   BACK PAIN Has been dealing with this pain off and on for 14 years -- has changed to [facility] which has helped some. Presents just below right shoulder blade, although reports leaning a lot on left arm -- posture.  Improving at this time, better then it was.  He is concerned for digestive issues -- no N&V.  Does have underlying constipation at baseline, drinks lots of water and eats foods high in fiber.  Recent labs reassuring. Imaging on 12/03/22 mild concavity of thoracic spine.  Duration: chronic, improving at this time Mechanism of injury: unknown Location: Right and midline thoracic Onset: gradual Severity: 2/10, improving Quality: sharp and aching Frequency: intermittent Radiation: none Aggravating factors: unknown -- movement does not aggravate it Status: fluctuating Treatments attempted: diet changes, muscular relaxer Relief with NSAIDs?: sometimes offers relief Nighttime pain:  no Paresthesias / decreased sensation:  no Bowel / bladder incontinence:  no Fevers:  no Dysuria / urinary frequency:  no   HYPERLIPIDEMIA Taking Rosuvastatin -- started over past months. Hyperlipidemia status: good compliance Satisfied with current treatment?  yes Side effects:  no Medication compliance: good compliance Past cholesterol meds: Crestor Supplements: none Aspirin:  no The 10-year ASCVD risk score (Arnett DK, et al., 2019) is: 6.6%   Values used to calculate the score:     Age: 43 years     Sex: Male     Is Non-Hispanic African American: No     Diabetic: No     Tobacco smoker: No     Systolic Blood  Pressure: 127 mmHg     Is BP treated: Yes     HDL Cholesterol: 42 mg/dL     Total Cholesterol: 238 mg/dL Chest pain:  no Coronary artery disease:  no Family history CAD:  no Family history early CAD:  no   Relevant past medical, surgical, family and social history reviewed and updated as indicated. Interim medical history since our last visit reviewed. Allergies and medications reviewed and updated.  Review of Systems  Constitutional:  Negative for activity change, diaphoresis, fatigue and fever.  Respiratory:  Negative for cough, chest tightness, shortness of breath and wheezing.   Cardiovascular:  Negative for chest pain, palpitations and leg swelling.  Gastrointestinal: Negative.   Musculoskeletal:  Positive for arthralgias and back pain.  Neurological: Negative.   Psychiatric/Behavioral: Negative.     Per HPI unless specifically indicated above     Objective:    BP 127/85   Pulse 68   Temp 98 F (36.7 C) (Oral)   Ht 5' 9.69" (1.77 m)   Wt 212 lb 3.2 oz (96.3 kg)   SpO2 98%   BMI 30.72 kg/m   Wt Readings from Last 3 Encounters:  01/12/23 212 lb 3.2 oz (96.3 kg)  12/06/22 213 lb (96.6 kg)  12/03/22 213 lb 12.8 oz (97 kg)    Physical Exam Vitals and nursing note reviewed.  Constitutional:  General: He is awake. He is not in acute distress.    Appearance: He is well-developed and well-groomed. He is obese. He is not ill-appearing or toxic-appearing.  HENT:     Head: Normocephalic.     Right Ear: Hearing and external ear normal.     Left Ear: Hearing and external ear normal.  Eyes:     General: Lids are normal.     Extraocular Movements: Extraocular movements intact.     Conjunctiva/sclera: Conjunctivae normal.  Neck:     Thyroid: No thyromegaly.     Vascular: No carotid bruit.  Cardiovascular:     Rate and Rhythm: Normal rate and regular rhythm.     Heart sounds: Normal heart sounds. No murmur heard.    No gallop.  Pulmonary:     Effort: Pulmonary  effort is normal. No accessory muscle usage or respiratory distress.     Breath sounds: Normal breath sounds.  Abdominal:     General: Bowel sounds are normal. There is no distension.     Palpations: Abdomen is soft.     Tenderness: There is no abdominal tenderness.  Musculoskeletal:     Cervical back: Full passive range of motion without pain.     Thoracic back: Tenderness present. No swelling, deformity, spasms or bony tenderness. Normal range of motion.     Right lower leg: No edema.     Left lower leg: No edema.  Lymphadenopathy:     Cervical: No cervical adenopathy.  Skin:    General: Skin is warm.     Capillary Refill: Capillary refill takes less than 2 seconds.  Neurological:     Mental Status: He is alert and oriented to person, place, and time.     Deep Tendon Reflexes: Reflexes are normal and symmetric.     Reflex Scores:      Brachioradialis reflexes are 2+ on the right side and 2+ on the left side.      Patellar reflexes are 2+ on the right side and 2+ on the left side. Psychiatric:        Attention and Perception: Attention normal.        Mood and Affect: Mood normal.        Speech: Speech normal.        Behavior: Behavior normal. Behavior is cooperative.        Thought Content: Thought content normal.    Results for orders placed or performed in visit on 12/03/22  Celiac Disease Panel  Result Value Ref Range   Endomysial IgA Negative Negative   Transglutaminase IgA <2 0 - 3 U/mL   IgA/Immunoglobulin A, Serum 174 90 - 386 mg/dL  [id number] Egg, Whole  Result Value Ref Range   Egg, Whole IgE <0.10 Class 0 kU/L  Allergen Profile, Basic Food ([facility])  Result Value Ref Range   Class Description Allergens Comment    Milk IgE <0.10 Class 0 kU/L   Codfish IgE <0.10 Class 0 kU/L   Wheat IgE <0.10 Class 0 kU/L   Allergen Corn, IgE <0.10 Class 0 kU/L   Peanut IgE <0.10 Class 0 kU/L   Soybean IgE <0.10 Class 0 kU/L   Shrimp IgE <0.10 Class 0 kU/L   Pork IgE <0.10  Class 0 kU/L   Beef IgE <0.10 Class 0 kU/L   [id number] Mussel <0.10 Class 0 kU/L   Tuna <0.10 Class 0 kU/L   Allergen Salmon IgE <0.10 Class 0 kU/L   Chocolate/[place] IgE <0.10 Class 0 kU/L  Egg, Whole IgE <0.10 Class 0 kU/L  Urinalysis, Routine w reflex microscopic  Result Value Ref Range   Specific Gravity, UA <1.005 (L) 1.005 - 1.030   pH, UA 5.5 5.0 - 7.5   Color, UA Yellow Yellow   Appearance Ur Clear Clear   Leukocytes,UA Negative Negative   Protein,UA Trace (A) Negative/Trace   Glucose, UA Negative Negative   Ketones, UA Negative Negative   RBC, UA Negative Negative   Bilirubin, UA Negative Negative   Urobilinogen, Ur 0.2 0.2 - 1.0 mg/dL   Nitrite, UA Negative Negative   Microscopic Examination Comment       Assessment & Plan:   Problem List Items Addressed This Visit       Other   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  Recent imaging did show concavity to thoracic spine.  Continue Robaxin as needed and Lidocaine patches, see if benefit to this.  Recommend visit with chiropractor.        Hyperlipidemia, mixed    Ongoing, stable.  Recheck lipid panel today.  Continue current medication regimen and adjust as needed.      Relevant Medications   rosuvastatin (CRESTOR) 10 MG tablet   Other Relevant Orders   Lipid Panel w/o Chol/HDL Ratio   Comprehensive metabolic panel     Follow up plan: Return in about 6 months (around 07/14/2023) for HTN/HLD.

## 2023-01-12 NOTE — Assessment & Plan Note (Signed)
Chronic, improving.  Recent imaging did show concavity to thoracic spine.  Continue Robaxin as needed and Lidocaine patches, see if benefit to this.  Recommend visit with chiropractor.

## 2023-01-13 LAB — LIPID PANEL W/O CHOL/HDL RATIO
Cholesterol, Total: 137 mg/dL (ref 100–199)
HDL: 41 mg/dL (ref >39)
LDL Chol Calc (NIH): 71 mg/dL (ref 0–99)
Triglycerides: 146 mg/dL (ref 0–149)
VLDL Cholesterol Cal: 25 mg/dL (ref 5–40)

## 2023-01-13 LAB — COMPREHENSIVE METABOLIC PANEL
ALT: 21 IU/L (ref 0–44)
AST: 25 IU/L (ref 0–40)
Albumin: 5 g/dL — ABNORMAL HIGH (ref 3.8–4.9)
Alkaline Phosphatase: 111 IU/L (ref 44–121)
BUN/Creatinine Ratio: 13 (ref 9–20)
BUN: 14 mg/dL (ref 6–24)
Bilirubin Total: 0.3 mg/dL (ref 0.0–1.2)
CO2: 21 mmol/L (ref 20–29)
Calcium: 9.3 mg/dL (ref 8.7–10.2)
Chloride: 103 mmol/L (ref 96–106)
Creatinine, Ser: 1.1 mg/dL (ref 0.76–1.27)
Globulin, Total: 2.5 g/dL (ref 1.5–4.5)
Glucose: 88 mg/dL (ref 70–99)
Potassium: 4.2 mmol/L (ref 3.5–5.2)
Sodium: 142 mmol/L (ref 134–144)
Total Protein: 7.5 g/dL (ref 6.0–8.5)
eGFR: 81 mL/min/{1.73_m2} (ref >59)

## 2023-01-13 NOTE — Progress Notes (Signed)
Contacted via MyChart   Good afternoon Bryan Mccoy, I have great news!!  Your cholesterol levels are much improved with medication on board, continue this.  LDL went from 146 to 71 and total cholesterol from 238 to 137.  Wonderful.  Kidney and liver function remain normal.  Any questions? Keep being amazing!!  Thank you for allowing me to participate in your care.  I appreciate you. Kindest regards, Grasiela Jonsson

## 2023-04-15 ENCOUNTER — Encounter: Payer: Self-pay | Admitting: Nurse Practitioner

## 2023-04-15 MED ORDER — ROSUVASTATIN CALCIUM 10 MG PO TABS: 10.0000 mg | ORAL_TABLET | Freq: Every day | ORAL | 4 refills | Status: DC

## 2023-04-15 MED ORDER — AMLODIPINE BESYLATE 5 MG PO TABS: 5.0000 mg | ORAL_TABLET | Freq: Every day | ORAL | 4 refills | Status: DC

## 2023-05-27 ENCOUNTER — Encounter: Payer: Self-pay | Admitting: Nurse Practitioner

## 2023-06-05 DIAGNOSIS — R030 Elevated blood-pressure reading, without diagnosis of hypertension: Secondary | ICD-10-CM | POA: Diagnosis not present

## 2023-06-05 DIAGNOSIS — H66002 Acute suppurative otitis media without spontaneous rupture of ear drum, left ear: Secondary | ICD-10-CM | POA: Diagnosis not present

## 2023-06-11 ENCOUNTER — Telehealth: Payer: Self-pay | Admitting: Nurse Practitioner

## 2023-06-11 NOTE — Telephone Encounter (Signed)
Copied from CRM [id number]. Topic: Appointment Scheduling - Scheduling Inquiry for Clinic >> Jun 11, 2023 10:25 AM Epimenio Foot F wrote: Reason for CRM: Pt is currently a pt of Jolene's and wanted to know was it any way that Jolene could see his son (new pt) before March. Pt's son has ADHD or signs of ADHD and needs to be seen. Please follow up with pt if possible.

## 2023-06-12 NOTE — Telephone Encounter (Signed)
Called patient to set his son up with an appointment, he stated that they ended up finding a doctor in [place] for him.  I let him know that we have an opening on Monday he stated that they would be out of town so that wouldn't work.  Advised him if they changed their minds to give [place] a call back.

## 2023-07-11 NOTE — Patient Instructions (Signed)
Be Involved in Caring For Your Health:  Taking Medications When medications are taken as directed, they can greatly improve your health. But if they are not taken as prescribed, they may not work. In some cases, not taking them correctly can be harmful. To help ensure your treatment remains effective and safe, understand your medications and how to take them. Bring your medications to each visit for review by your provider.  Your lab results, notes, and after visit summary will be available on My Chart. We strongly encourage you to use this feature. If lab results are abnormal the clinic will contact you with the appropriate steps. If the clinic does not contact you assume the results are satisfactory. You can always view your results on My Chart. If you have questions regarding your health or results, please contact the clinic during office hours. You can also ask questions on My Chart.  We at [facility] are grateful that you chose [place] to provide your care. We strive to provide evidence-based and compassionate care and are always looking for feedback. If you get a survey from the clinic please complete this so we can hear your opinions.  Heart-Healthy Eating Plan Many factors influence your heart health, including eating and exercise habits. Heart health is also called coronary health. Coronary risk increases with abnormal blood fat (lipid) levels. A heart-healthy eating plan includes limiting unhealthy fats, increasing healthy fats, limiting salt (sodium) intake, and making other diet and lifestyle changes. What is my plan? Your health care provider may recommend that: You limit your fat intake to _________% or less of your total calories each day. You limit your saturated fat intake to _________% or less of your total calories each day. You limit the amount of cholesterol in your diet to less than _________ mg per day. You limit the amount of sodium in your diet to less than _________  mg per day. What are tips for following this plan? Cooking Cook foods using methods other than frying. Baking, boiling, grilling, and broiling are all good options. Other ways to reduce fat include: Removing the skin from poultry. Removing all visible fats from meats. Steaming vegetables in water or broth. Meal planning  At meals, imagine dividing your plate into fourths: Fill one-half of your plate with vegetables and green salads. Fill one-fourth of your plate with whole grains. Fill one-fourth of your plate with lean protein foods. Eat 2-4 cups of vegetables per day. One cup of vegetables equals 1 cup (91 g) broccoli or cauliflower florets, 2 medium carrots, 1 large bell pepper, 1 large sweet potato, 1 large tomato, 1 medium white potato, 2 cups (150 g) raw leafy greens. Eat 1-2 cups of fruit per day. One cup of fruit equals 1 small apple, 1 large banana, 1 cup (237 g) mixed fruit, 1 large orange,  cup (82 g) dried fruit, 1 cup (240 mL) 100% fruit juice. Eat more foods that contain soluble fiber. Examples include apples, broccoli, carrots, beans, peas, and barley. Aim to get 25-30 g of fiber per day. Increase your consumption of legumes, nuts, and seeds to 4-5 servings per week. One serving of dried beans or legumes equals  cup (90 g) cooked, 1 serving of nuts is  oz (12 almonds, 24 pistachios, or 7 walnut halves), and 1 serving of seeds equals  oz (8 g). Fats Choose healthy fats more often. Choose monounsaturated and polyunsaturated fats, such as olive and canola oils, avocado oil, flaxseeds, walnuts, almonds, and seeds. Eat  more omega-3 fats. Choose salmon, mackerel, sardines, tuna, flaxseed oil, and ground flaxseeds. Aim to eat fish at least 2 times each week. Check food labels carefully to identify foods with trans fats or high amounts of saturated fat. Limit saturated fats. These are found in animal products, such as meats, butter, and cream. Plant sources of saturated fats  include palm oil, palm kernel oil, and coconut oil. Avoid foods with partially hydrogenated oils in them. These contain trans fats. Examples are stick margarine, some tub margarines, cookies, crackers, and other baked goods. Avoid fried foods. General information Eat more home-cooked food and less restaurant, buffet, and fast food. Limit or avoid alcohol. Limit foods that are high in added sugar and simple starches such as foods made using white refined flour (white breads, pastries, sweets). Lose weight if you are overweight. Losing just 5-10% of your body weight can help your overall health and prevent diseases such as diabetes and heart disease. Monitor your sodium intake, especially if you have high blood pressure. Talk with your health care provider about your sodium intake. Try to incorporate more vegetarian meals weekly. What foods should I eat? Fruits All fresh, canned (in natural juice), or frozen fruits. Vegetables Fresh or frozen vegetables (raw, steamed, roasted, or grilled). Green salads. Grains Most grains. Choose whole wheat and whole grains most of the time. Rice and pasta, including brown rice and pastas made with whole wheat. Meats and other proteins Lean, well-trimmed beef, veal, pork, and lamb. Chicken and [place] without skin. All fish and shellfish. Wild duck, rabbit, pheasant, and venison. Egg whites or low-cholesterol egg substitutes. Dried beans, peas, lentils, and tofu. Seeds and most nuts. Dairy Low-fat or nonfat cheeses, including ricotta and mozzarella. Skim or 1% milk (liquid, powdered, or evaporated). Buttermilk made with low-fat milk. Nonfat or low-fat yogurt. Fats and oils Non-hydrogenated (trans-free) margarines. Vegetable oils, including soybean, sesame, sunflower, olive, avocado, peanut, safflower, corn, canola, and cottonseed. Salad dressings or mayonnaise made with a vegetable oil. Beverages Water (mineral or sparkling). Coffee and tea. Unsweetened ice  tea. Diet beverages. Sweets and desserts Sherbet, gelatin, and fruit ice. Small amounts of dark chocolate. Limit all sweets and desserts. Seasonings and condiments All seasonings and condiments. The items listed above may not be a complete list of foods and beverages you can eat. Contact a dietitian for more options. What foods should I avoid? Fruits Canned fruit in heavy syrup. Fruit in cream or butter sauce. Fried fruit. Limit coconut. Vegetables Vegetables cooked in cheese, cream, or butter sauce. Fried vegetables. Grains Breads made with saturated or trans fats, oils, or whole milk. Croissants. Sweet rolls. Donuts. High-fat crackers, such as cheese crackers and chips. Meats and other proteins Fatty meats, such as hot dogs, ribs, sausage, bacon, rib-eye roast or steak. High-fat deli meats, such as salami and bologna. Caviar. Domestic duck and goose. Organ meats, such as liver. Dairy Cream, sour cream, cream cheese, and creamed cottage cheese. Whole-milk cheeses. Whole or 2% milk (liquid, evaporated, or condensed). Whole buttermilk. Cream sauce or high-fat cheese sauce. Whole-milk yogurt. Fats and oils Meat fat, or shortening. Cocoa butter, hydrogenated oils, palm oil, coconut oil, palm kernel oil. Solid fats and shortenings, including bacon fat, salt pork, lard, and butter. Nondairy cream substitutes. Salad dressings with cheese or sour cream. Beverages Regular sodas and any drinks with added sugar. Sweets and desserts Frosting. Pudding. Cookies. Cakes. Pies. Milk chocolate or white chocolate. Buttered syrups. Full-fat ice cream or ice cream drinks. The items listed above may  not be a complete list of foods and beverages to avoid. Contact a dietitian for more information. Summary Heart-healthy meal planning includes limiting unhealthy fats, increasing healthy fats, limiting salt (sodium) intake and making other diet and lifestyle changes. Lose weight if you are overweight. Losing just  5-10% of your body weight can help your overall health and prevent diseases such as diabetes and heart disease. Focus on eating a balance of foods, including fruits and vegetables, low-fat or nonfat dairy, lean protein, nuts and legumes, whole grains, and heart-healthy oils and fats. This information is not intended to replace advice given to you by your health care provider. Make sure you discuss any questions you have with your health care provider. Document Revised: 08/12/2021 Document Reviewed: 08/12/2021 Elsevier Patient Education  2024 [employer].

## 2023-07-13 ENCOUNTER — Ambulatory Visit (INDEPENDENT_AMBULATORY_CARE_PROVIDER_SITE_OTHER): Payer: BC Managed Care – PPO | Admitting: Nurse Practitioner

## 2023-07-13 ENCOUNTER — Encounter: Payer: Self-pay | Admitting: Nurse Practitioner

## 2023-07-13 VITALS — BP 136/88 | HR 71 | Temp 98.1°F | Wt 219.6 lb

## 2023-07-13 DIAGNOSIS — Z6831 Body mass index (BMI) 31.0-31.9, adult: Secondary | ICD-10-CM

## 2023-07-13 DIAGNOSIS — I10 Essential (primary) hypertension: Secondary | ICD-10-CM | POA: Diagnosis not present

## 2023-07-13 DIAGNOSIS — E66811 Obesity, class 1: Secondary | ICD-10-CM | POA: Diagnosis not present

## 2023-07-13 DIAGNOSIS — B351 Tinea unguium: Secondary | ICD-10-CM | POA: Diagnosis not present

## 2023-07-13 DIAGNOSIS — E782 Mixed hyperlipidemia: Secondary | ICD-10-CM | POA: Diagnosis not present

## 2023-07-13 DIAGNOSIS — E6609 Other obesity due to excess calories: Secondary | ICD-10-CM

## 2023-07-13 MED ORDER — AMLODIPINE BESYLATE 10 MG PO TABS: 10.0000 mg | ORAL_TABLET | Freq: Every day | ORAL | 4 refills | Status: DC

## 2023-07-13 MED ORDER — CICLOPIROX 8 % EX SOLN: Freq: Every day | CUTANEOUS | 2 refills | Status: AC

## 2023-07-13 NOTE — Progress Notes (Signed)
BP 136/88 (BP Location: Left Arm, Cuff Size: Normal)   Pulse 71   Temp 98.1 F (36.7 C) (Oral)   Wt 219 lb 9.6 oz (99.6 kg)   SpO2 97%   BMI 31.79 kg/m    Subjective:    Patient ID: Bryan Mccoy, male    DOB: 1971/02/21, 52 y.o.   MRN: [id number]  HPI: Bryan Mccoy is a 52 y.o. male  Chief Complaint  Patient presents with   Hypertension   Hyperlipidemia   Toe Pain    Patient says he has something going on with his toe. Patient says he smashed his toe about 8 month ago, and says the toenail fell off and then a new toenail is growing back. Patient says the new nail that has grown back is now about to fall out.    HYPERTENSION / HYPERLIPIDEMIA Taking Crestor and Amlodipine.  Continues Allopurinol gout, has a little twinge two weeks ago.  Satisfied with current treatment? yes Duration of hypertension: chronic BP monitoring frequency: twice a day BP range: 130-140/80 range BP medication side effects: no Duration of hyperlipidemia: chronic Aspirin: no Recent stressors: no Recurrent headaches: no Visual changes: no Palpitations: no Dyspnea: no Chest pain: no Lower extremity edema: no Dizzy/lightheaded: no   TOE ISSUES To left great toe.  Hit toe >8 months ago, toenail eventually fell off.  Then new toenail grew back, but currently it is falling off. Duration: months Involved toe: leftbig toe  Mechanism of injury: trauma Onset: gradual Severity: none  Quality: none Frequency: none Radiation: none Aggravating factors: unknown  Alleviating factors: Biotin  Status: fluctuating Treatments attempted: Biotin  Relief with NSAIDs?: significant Morning stiffness: no Redness: no  Bruising: no Swelling: no Paresthesias / decreased sensation: no Fevers: no   Relevant past medical, surgical, family and social history reviewed and updated as indicated. Interim medical history since our last visit reviewed. Allergies and medications reviewed and updated.  Review of  Systems  Constitutional:  Negative for activity change, diaphoresis, fatigue and fever.  Respiratory:  Negative for cough, chest tightness, shortness of breath and wheezing.   Cardiovascular:  Negative for chest pain, palpitations and leg swelling.  Gastrointestinal: Negative.   Neurological: Negative.   Psychiatric/Behavioral: Negative.      Per HPI unless specifically indicated above     Objective:    BP 136/88 (BP Location: Left Arm, Cuff Size: Normal)   Pulse 71   Temp 98.1 F (36.7 C) (Oral)   Wt 219 lb 9.6 oz (99.6 kg)   SpO2 97%   BMI 31.79 kg/m   Wt Readings from Last 3 Encounters:  07/13/23 219 lb 9.6 oz (99.6 kg)  01/12/23 212 lb 3.2 oz (96.3 kg)  12/06/22 213 lb (96.6 kg)    Physical Exam Vitals and nursing note reviewed.  Constitutional:      General: He is awake. He is not in acute distress.    Appearance: Normal appearance. He is well-developed and well-groomed. He is obese. He is not ill-appearing or toxic-appearing.  HENT:     Head: Normocephalic.     Right Ear: Hearing and external ear normal.     Left Ear: Hearing and external ear normal.  Eyes:     General: Lids are normal.     Extraocular Movements: Extraocular movements intact.     Conjunctiva/sclera: Conjunctivae normal.  Neck:     Thyroid: No thyromegaly.     Vascular: No carotid bruit.  Cardiovascular:     Rate  and Rhythm: Normal rate and regular rhythm.     Pulses:          Dorsalis pedis pulses are 2+ on the right side and 2+ on the left side.       Posterior tibial pulses are 2+ on the right side and 2+ on the left side.     Heart sounds: Normal heart sounds. No murmur heard.    No gallop.  Pulmonary:     Effort: No accessory muscle usage or respiratory distress.     Breath sounds: Normal breath sounds.  Abdominal:     General: Bowel sounds are normal. There is no distension.     Palpations: Abdomen is soft.     Tenderness: There is no abdominal tenderness.  Musculoskeletal:      Cervical back: Full passive range of motion without pain.     Right lower leg: No edema.     Left lower leg: No edema.     Right foot: Normal range of motion.     Left foot: Normal range of motion.  Feet:     Right foot:     Protective Sensation: 10 sites tested.  10 sites sensed.     Skin integrity: Skin integrity normal.     Toenail Condition: Right toenails are normal.     Left foot:     Protective Sensation: 10 sites tested.  10 sites sensed.     Skin integrity: Skin integrity normal.     Toenail Condition: Left toenails are abnormally thick. Fungal disease present. Lymphadenopathy:     Cervical: No cervical adenopathy.  Skin:    General: Skin is warm.     Capillary Refill: Capillary refill takes less than 2 seconds.  Neurological:     Mental Status: He is alert and oriented to person, place, and time.     Deep Tendon Reflexes: Reflexes are normal and symmetric.     Reflex Scores:      Brachioradialis reflexes are 2+ on the right side and 2+ on the left side.      Patellar reflexes are 2+ on the right side and 2+ on the left side. Psychiatric:        Attention and Perception: Attention normal.        Mood and Affect: Mood normal.        Speech: Speech normal.        Behavior: Behavior normal. Behavior is cooperative.        Thought Content: Thought content normal.     Results for orders placed or performed in visit on 01/12/23  Lipid Panel w/o Chol/HDL Ratio   Collection Time: 01/12/23 11:23 AM  Result Value Ref Range   Cholesterol, Total 137 100 - 199 mg/dL   Triglycerides 742 0 - 149 mg/dL   HDL 41 >59 mg/dL   VLDL Cholesterol Cal 25 5 - 40 mg/dL   LDL Chol Calc (NIH) 71 0 - 99 mg/dL  Comprehensive metabolic panel   Collection Time: 01/12/23 11:23 AM  Result Value Ref Range   Glucose 88 70 - 99 mg/dL   BUN 14 6 - 24 mg/dL   Creatinine, Ser 5.63 0.76 - 1.27 mg/dL   eGFR 81 >87 FI/EPP/2.95   BUN/Creatinine Ratio 13 9 - 20   Sodium 142 134 - 144 mmol/L    Potassium 4.2 3.5 - 5.2 mmol/L   Chloride 103 96 - 106 mmol/L   CO2 21 20 - 29 mmol/L   Calcium 9.3 8.7 -  10.2 mg/dL   Total Protein 7.5 6.0 - 8.5 g/dL   Albumin 5.0 (H) 3.8 - 4.9 g/dL   Globulin, Total 2.5 1.5 - 4.5 g/dL   Bilirubin Total 0.3 0.0 - 1.2 mg/dL   Alkaline Phosphatase 111 44 - 121 IU/L   AST 25 0 - 40 IU/L   ALT 21 0 - 44 IU/L      Assessment & Plan:   Problem List Items Addressed This Visit       Cardiovascular and Mediastinum   Essential hypertension - Primary   Chronic, stable.  BP above goal in office today and on home readings.  Will increase Amlodipine to 10 MG daily, educated patient on this.  Recommend he monitor BP at least a few mornings a week at home and document.  DASH diet at home.  Labs today: CMP.  Can restart an ACE or ARB in future as needed.         Relevant Medications   amLODipine (NORVASC) 10 MG tablet   Other Relevant Orders   Comprehensive metabolic panel     Musculoskeletal and Integument   Onychomycosis   To left great toe noted.  Thick and yellow, with current toenail coming off.  Will send in Penlac to trial once new nail comes in.  If no benefit and continuing to lose nails, then may need visit with podiatry.      Relevant Medications   ciclopirox (PENLAC) 8 % solution     Other   Hyperlipidemia, mixed   Ongoing, stable.  Recheck lipid panel today.  Continue current medication regimen and adjust as needed.      Relevant Medications   amLODipine (NORVASC) 10 MG tablet   Other Relevant Orders   Comprehensive metabolic panel   Lipid Panel w/o Chol/HDL Ratio   Obesity   BMI 29.56.  Recommended eating smaller high protein, low fat meals more frequently and exercising 30 mins a day 5 times a week with a goal of 10-15lb weight loss in the next 3 months. Patient voiced their understanding and motivation to adhere to these recommendations.         Follow up plan: Return in about 5 weeks (around 08/17/2023) for HTN -- increased  Amlodipine to 10 MG.

## 2023-07-13 NOTE — Assessment & Plan Note (Signed)
Ongoing, stable.  Recheck lipid panel today.  Continue current medication regimen and adjust as needed.

## 2023-07-13 NOTE — Assessment & Plan Note (Signed)
To left great toe noted.  Thick and yellow, with current toenail coming off.  Will send in Penlac to trial once new nail comes in.  If no benefit and continuing to lose nails, then may need visit with podiatry.

## 2023-07-13 NOTE — Assessment & Plan Note (Signed)
BMI 31.79.  Recommended eating smaller high protein, low fat meals more frequently and exercising 30 mins a day 5 times a week with a goal of 10-15lb weight loss in the next 3 months. Patient voiced their understanding and motivation to adhere to these recommendations.  

## 2023-07-13 NOTE — Assessment & Plan Note (Signed)
Chronic, stable.  BP above goal in office today and on home readings.  Will increase Amlodipine to 10 MG daily, educated patient on this.  Recommend he monitor BP at least a few mornings a week at home and document.  DASH diet at home.  Labs today: CMP.  Can restart an ACE or ARB in future as needed.

## 2023-07-14 LAB — COMPREHENSIVE METABOLIC PANEL
ALT: 20 [IU]/L (ref 0–44)
AST: 23 [IU]/L (ref 0–40)
Albumin: 4.7 g/dL (ref 3.8–4.9)
Alkaline Phosphatase: 112 [IU]/L (ref 44–121)
BUN/Creatinine Ratio: 12 (ref 9–20)
BUN: 12 mg/dL (ref 6–24)
Bilirubin Total: 0.3 mg/dL (ref 0.0–1.2)
CO2: 25 mmol/L (ref 20–29)
Calcium: 8.9 mg/dL (ref 8.7–10.2)
Chloride: 102 mmol/L (ref 96–106)
Creatinine, Ser: 1.03 mg/dL (ref 0.76–1.27)
Globulin, Total: 2.1 g/dL (ref 1.5–4.5)
Glucose: 83 mg/dL (ref 70–99)
Potassium: 3.9 mmol/L (ref 3.5–5.2)
Sodium: 141 mmol/L (ref 134–144)
Total Protein: 6.8 g/dL (ref 6.0–8.5)
eGFR: 87 mL/min/{1.73_m2} (ref >59)

## 2023-07-14 LAB — LIPID PANEL W/O CHOL/HDL RATIO
Cholesterol, Total: 153 mg/dL (ref 100–199)
HDL: 40 mg/dL (ref >39)
LDL Chol Calc (NIH): 90 mg/dL (ref 0–99)
Triglycerides: 126 mg/dL (ref 0–149)
VLDL Cholesterol Cal: 23 mg/dL (ref 5–40)

## 2023-07-14 NOTE — Progress Notes (Signed)
Contacted via MyChart   Good morning Bryan Mccoy, your labs have returned and are overall nice and stable. Any questions?  Merry Christmas!!  No changes needed at this time. Keep being amazing!!  Thank you for allowing me to participate in your care.  I appreciate you. Kindest regards, Alaiah Lundy

## 2023-07-23 ENCOUNTER — Ambulatory Visit
Admission: EM | Admit: 2023-07-23 | Discharge: 2023-07-23 | Disposition: A | Discharge status: Home / Self Care | Payer: BC Managed Care – PPO | Attending: Emergency Medicine | Admitting: Emergency Medicine

## 2023-07-23 ENCOUNTER — Ambulatory Visit (INDEPENDENT_AMBULATORY_CARE_PROVIDER_SITE_OTHER): Payer: BC Managed Care – PPO

## 2023-07-23 DIAGNOSIS — J069 Acute upper respiratory infection, unspecified: Secondary | ICD-10-CM | POA: Diagnosis not present

## 2023-07-23 DIAGNOSIS — R0602 Shortness of breath: Secondary | ICD-10-CM | POA: Diagnosis not present

## 2023-07-23 DIAGNOSIS — R918 Other nonspecific abnormal finding of lung field: Secondary | ICD-10-CM | POA: Diagnosis not present

## 2023-07-23 DIAGNOSIS — R051 Acute cough: Secondary | ICD-10-CM

## 2023-07-23 DIAGNOSIS — J984 Other disorders of lung: Secondary | ICD-10-CM | POA: Diagnosis not present

## 2023-07-23 DIAGNOSIS — J189 Pneumonia, unspecified organism: Secondary | ICD-10-CM

## 2023-07-23 MED ORDER — ALBUTEROL SULFATE HFA 108 (90 BASE) MCG/ACT IN AERS: 1.0000 | INHALATION_SPRAY | Freq: Four times a day (QID) | RESPIRATORY_TRACT | 0 refills | Status: DC | PRN

## 2023-07-23 MED ORDER — CEFDINIR 300 MG PO CAPS: 300.0000 mg | ORAL_CAPSULE | Freq: Two times a day (BID) | ORAL | 0 refills | Status: AC

## 2023-07-23 MED ORDER — AZITHROMYCIN 250 MG PO TABS: 250.0000 mg | ORAL_TABLET | Freq: Every day | ORAL | 0 refills | Status: DC

## 2023-07-23 NOTE — ED Triage Notes (Signed)
 Patient to Urgent Care with complaints of cough/ fevers/ shortness of breath/ chest congestion/ gurgling in his throat in the morning/ back pain.   Seen at an urgent care in [place] - temp 102 on Saturday. Negative for flu and RSV. Exposed to pneumonia.   Symptoms started 5 days ago. Reports symptoms have improved some with Mucinex.

## 2023-07-23 NOTE — Discharge Instructions (Addendum)
 Use the albuterol  inhaler and take the Zithromax  as directed.    Do not take colchicine  while taking Zithromax .    The chest x-ray is pending.  I will call you with the result.    Follow up with your primary care provider tomorrow.  Go to the emergency department if you have worsening symptoms.

## 2023-07-23 NOTE — ED Provider Notes (Signed)
 [facility]    CSN: [id number] Arrival date & time: 07/23/23  1348      History   Chief Complaint Chief Complaint  Patient presents with   Cough   Shortness of Breath    HPI Bryan Mccoy is a 53 y.o. male.  Patient presents with 5-6 day history of fever, congestion, cough, shortness of breath.  No chest pain.  Tmax 102 several days ago; Temp 99 this morning at home.  He has taken ibuprofen  and Mucinex today.  Patient states he was seen at an urgent care in [place]  on 07/19/2023 and was negative for flu and RSV; symptomatic treatment.  The history is provided by the patient and medical records.    Past Medical History:  Diagnosis Date   Allergy    Anxiety    Depression    Hypercholesteremia    Hypertension     Patient Active Problem List   Diagnosis Date Noted   Onychomycosis 07/13/2023   Chronic right-sided thoracic back pain 12/03/2022   Polyp of colon    Obesity 07/26/2020   Gout 06/26/2020   Hyperlipidemia, mixed 12/25/2019   Essential hypertension 12/01/2019    Past Surgical History:  Procedure Laterality Date   ANKLE FRACTURE SURGERY     right   APPENDECTOMY     COLONOSCOPY WITH PROPOFOL  N/A 08/22/2020   Procedure: COLONOSCOPY WITH PROPOFOL ;  Surgeon: Janalyn Keene NOVAK, MD;  Location: [facility] ENDOSCOPY;  Service: Endoscopy;  Laterality: N/A;   FRACTURE SURGERY         Home Medications    Prior to Admission medications   Medication Sig Start Date End Date Taking? Authorizing Provider  albuterol  (VENTOLIN  HFA) 108 (90 Base) MCG/ACT inhaler Inhale 1-2 puffs into the lungs every 6 (six) hours as needed. 07/23/23  Yes Corlis Burnard DEL, NP  azithromycin  (ZITHROMAX ) 250 MG tablet Take 1 tablet (250 mg total) by mouth daily. Take first 2 tablets together, then 1 every day until finished. 07/23/23  Yes Corlis Burnard DEL, NP  cefdinir  (OMNICEF ) 300 MG capsule Take 1 capsule (300 mg total) by mouth 2 (two) times daily for 7 days. 07/23/23 07/30/23 Yes Corlis Burnard DEL, NP  allopurinol  (ZYLOPRIM ) 100 MG tablet Take 1 tablet (100 mg total) by mouth daily. 11/17/22   Cannady, Jolene T, NP  amLODipine  (NORVASC ) 10 MG tablet Take 1 tablet (10 mg total) by mouth daily. 07/13/23   Cannady, Jolene T, NP  aspirin 81 MG EC tablet Take 81 mg by mouth daily. Swallow whole.    [provider]  ciclopirox  (PENLAC ) 8 % solution Apply topically at bedtime. Apply over nail and surrounding skin. Apply daily over previous coat. After seven (7) days, may remove with alcohol and continue cycle. 07/13/23   Cannady, Jolene T, NP  colchicine  0.6 MG tablet Take 2 tablets immediately, then 1 tablet twice daily for up to 7 days max. Discontinue if you develop stomach pains or diarrhea. 11/13/22   Cannady, Jolene T, NP  ibuprofen  (ADVIL ) 600 MG tablet Take 1 tablet (600 mg total) by mouth every 8 (eight) hours as needed. 10/15/21   Moishe Chiquita CHRISTELLA, NP  methocarbamol  (ROBAXIN -750) 750 MG tablet Take 1 tablet (750 mg total) by mouth every 8 (eight) hours as needed for muscle spasms. 12/03/22   Cannady, Jolene T, NP  rosuvastatin  (CRESTOR ) 10 MG tablet Take 1 tablet (10 mg total) by mouth daily. 04/15/23   Cannady, Jolene T, NP    Family History Family History  Problem Relation Age of Onset   Hyperlipidemia Mother    Hypertension Mother    Migraines Sister    Hyperlipidemia Brother    Allergies Son    Lung cancer Maternal Grandfather    Diabetes Paternal Grandmother    Alzheimer's disease Paternal Grandfather     Social History Social History   Tobacco Use   Smoking status: Former    Current packs/day: 0.00    Average packs/day: 1.5 packs/day for 10.0 years (15.0 ttl pk-yrs)    Types: Cigarettes    Start date: 2000    Quit date: 2010    Years since quitting: 15.0   Smokeless tobacco: Never  Vaping Use   Vaping status: Never Used  Substance Use Topics   Alcohol use: Yes    Comment: very rare   Drug use: Never     Allergies   Milk-related compounds,  Peanut-containing drug products, and Amoxicillin   Review of Systems Review of Systems  Constitutional:  Positive for chills and fever.  HENT:  Positive for congestion. Negative for ear pain and sore throat.   Respiratory:  Positive for cough and shortness of breath.   Cardiovascular:  Negative for chest pain and palpitations.     Physical Exam Triage Vital Signs ED Triage Vitals  Encounter Vitals Group     BP 07/23/23 1529 136/88     Systolic BP Percentile --      Diastolic BP Percentile --      Pulse Rate 07/23/23 1519 97     Resp 07/23/23 1519 18     Temp 07/23/23 1519 98.3 F (36.8 C)     Temp src --      SpO2 07/23/23 1519 97 %     Weight --      Height --      Head Circumference --      Peak Flow --      Pain Score 07/23/23 1524 4     Pain Loc --      Pain Education --      Exclude from Growth Chart --    No data found.  Updated Vital Signs BP 136/88   Pulse 97   Temp 98.3 F (36.8 C)   Resp 18   SpO2 97%   Visual Acuity Right Eye Distance:   Left Eye Distance:   Bilateral Distance:    Right Eye Near:   Left Eye Near:    Bilateral Near:     Physical Exam Constitutional:      General: He is not in acute distress. HENT:     Right Ear: Tympanic membrane normal.     Left Ear: Tympanic membrane normal.     Nose: Nose normal.     Mouth/Throat:     Mouth: Mucous membranes are moist.     Pharynx: Oropharynx is clear.  Cardiovascular:     Rate and Rhythm: Normal rate and regular rhythm.     Heart sounds: Normal heart sounds.  Pulmonary:     Effort: Pulmonary effort is normal. No respiratory distress.     Breath sounds: Normal breath sounds.  Neurological:     Mental Status: He is alert.      UC Treatments / Results  Labs (all labs ordered are listed, but only abnormal results are displayed) Labs Reviewed - No data to display  EKG   Radiology DG Chest 2 View Result Date: 07/23/2023 CLINICAL DATA:  Cough short of breath EXAM: CHEST - 2  VIEW  COMPARISON:  None Available. FINDINGS: Lingular airspace disease. No pleural effusion. Normal cardiac size. Clustered calcification in the right low paratracheal reason presumably nodes from prior granulomatous disease. No pneumothorax IMPRESSION: Lingular airspace disease suspicious for pneumonia. Radiographic follow-up to resolution is recommended. Electronically Signed   By: Luke Bun M.D.   On: 07/23/2023 18:03    Procedures Procedures (including critical care time)  Medications Ordered in UC Medications - No data to display  Initial Impression / Assessment and Plan / UC Course  I have reviewed the triage vital signs and the nursing notes.  Pertinent labs & imaging results that were available during my care of the patient were reviewed by me and considered in my medical decision making (see chart for details).    Cough, shortness of breath, lingular pneumonia.  O2 sat 97% on room air.  CXR shows lingular pneumonia.  Treating today with Zithromax , cefdinir , and albuterol  inhaler.  Instructed patient to follow-up with his PCP tomorrow.  ED precautions given.  Education provided on shortness of breath and pneumonia.  He agrees to plan of care.  Final Clinical Impressions(s) / UC Diagnoses   Final diagnoses:  Acute cough  SOB (shortness of breath)  Lingular pneumonia     Discharge Instructions      Use the albuterol  inhaler and take the Zithromax  as directed.    Do not take colchicine  while taking Zithromax .    The chest x-ray is pending.  I will call you with the result.    Follow up with your primary care provider tomorrow.  Go to the emergency department if you have worsening symptoms.        ED Prescriptions     Medication Sig Dispense Auth. Provider   albuterol  (VENTOLIN  HFA) 108 (90 Base) MCG/ACT inhaler Inhale 1-2 puffs into the lungs every 6 (six) hours as needed. 18 g Corlis Burnard DEL, NP   azithromycin  (ZITHROMAX ) 250 MG tablet Take 1 tablet (250 mg total)  by mouth daily. Take first 2 tablets together, then 1 every day until finished. 6 tablet Corlis Burnard DEL, NP   cefdinir  (OMNICEF ) 300 MG capsule Take 1 capsule (300 mg total) by mouth 2 (two) times daily for 7 days. 14 capsule Corlis Burnard DEL, NP      PDMP not reviewed this encounter.   Corlis Burnard DEL, NP 07/23/23 [phone]

## 2023-07-24 ENCOUNTER — Telehealth: Payer: Self-pay | Admitting: Nurse Practitioner

## 2023-07-24 NOTE — Telephone Encounter (Signed)
 FYI to provider

## 2023-07-24 NOTE — Telephone Encounter (Signed)
 Copied from CRM [id number]. Topic: General - Other >> Jul 24, 2023  9:53 AM Tobias CROME wrote: Reason for CRM: Pt seen at [facility] yesterday, 07/23/2023 and dx with pneumonia. Pt instructed to follow up with PCP, pt unsure he needs an appointment. Pt calling to inform Jolene about visit and that he will be taking medication as prescribed by UC. Pt has follow up already scheduled at end of month with Jolene.   Please call patient if anything else is needed

## 2023-08-16 NOTE — Patient Instructions (Signed)

## 2023-08-20 ENCOUNTER — Encounter: Payer: Self-pay | Admitting: Nurse Practitioner

## 2023-08-20 ENCOUNTER — Ambulatory Visit (INDEPENDENT_AMBULATORY_CARE_PROVIDER_SITE_OTHER): Payer: BC Managed Care – PPO | Admitting: Nurse Practitioner

## 2023-08-20 VITALS — BP 129/78 | HR 73 | Temp 97.9°F | Ht 69.7 in | Wt 214.0 lb

## 2023-08-20 DIAGNOSIS — Z1211 Encounter for screening for malignant neoplasm of colon: Secondary | ICD-10-CM | POA: Diagnosis not present

## 2023-08-20 DIAGNOSIS — I10 Essential (primary) hypertension: Secondary | ICD-10-CM

## 2023-08-20 NOTE — Progress Notes (Signed)
BP 129/78   Pulse 73   Temp 97.9 F (36.6 C) (Oral)   Ht 5' 9.7" (1.77 m)   Wt 214 lb (97.1 kg)   SpO2 98%   BMI 30.97 kg/m    Subjective:    Patient ID: Bryan Mccoy, male    DOB: 02-24-1971, 53 y.o.   MRN: [id number]  HPI: Bryan Mccoy is a 53 y.o. male  Chief Complaint  Patient presents with   Hypertension   HYPERTENSION without Chronic Kidney Disease Follow-up today for increase in Amlodipine to 10 MG on 07/13/23.  Has lost 5 pounds since last visit. Is pondering whether to get a vasectomy.   Hypertension status: stable  Satisfied with current treatment? yes Duration of hypertension: chronic BP monitoring frequency:  a few times a week BP range: 130/80 range BP medication side effects:  no Medication compliance: good compliance Aspirin: no Recurrent headaches: no Visual changes: no Palpitations: no Dyspnea: no Chest pain: no Lower extremity edema: no Dizzy/lightheaded: no   Relevant past medical, surgical, family and social history reviewed and updated as indicated. Interim medical history since our last visit reviewed. Allergies and medications reviewed and updated.  Review of Systems  Constitutional:  Negative for activity change, diaphoresis, fatigue and fever.  Respiratory:  Negative for cough, chest tightness, shortness of breath and wheezing.   Cardiovascular:  Negative for chest pain, palpitations and leg swelling.  Gastrointestinal: Negative.   Neurological: Negative.   Psychiatric/Behavioral: Negative.      Per HPI unless specifically indicated above     Objective:    BP 129/78   Pulse 73   Temp 97.9 F (36.6 C) (Oral)   Ht 5' 9.7" (1.77 m)   Wt 214 lb (97.1 kg)   SpO2 98%   BMI 30.97 kg/m   Wt Readings from Last 3 Encounters:  08/20/23 214 lb (97.1 kg)  07/13/23 219 lb 9.6 oz (99.6 kg)  01/12/23 212 lb 3.2 oz (96.3 kg)    Physical Exam Vitals and nursing note reviewed.  Constitutional:      General: He is awake. He is not  in acute distress.    Appearance: He is well-developed and well-groomed. He is not ill-appearing or toxic-appearing.  HENT:     Head: Normocephalic.     Right Ear: Hearing and external ear normal.     Left Ear: Hearing and external ear normal.  Eyes:     General: Lids are normal.     Extraocular Movements: Extraocular movements intact.     Conjunctiva/sclera: Conjunctivae normal.  Neck:     Thyroid: No thyromegaly.     Vascular: No carotid bruit.  Cardiovascular:     Rate and Rhythm: Normal rate and regular rhythm.     Heart sounds: Normal heart sounds. No murmur heard.    No gallop.  Pulmonary:     Effort: No accessory muscle usage or respiratory distress.     Breath sounds: Normal breath sounds.  Abdominal:     General: Bowel sounds are normal. There is no distension.     Palpations: Abdomen is soft.     Tenderness: There is no abdominal tenderness.  Musculoskeletal:     Cervical back: Full passive range of motion without pain.     Right lower leg: No edema.     Left lower leg: No edema.  Lymphadenopathy:     Cervical: No cervical adenopathy.  Skin:    General: Skin is warm.     Capillary  Refill: Capillary refill takes less than 2 seconds.  Neurological:     Mental Status: He is alert and oriented to person, place, and time.     Deep Tendon Reflexes: Reflexes are normal and symmetric.     Reflex Scores:      Brachioradialis reflexes are 2+ on the right side and 2+ on the left side.      Patellar reflexes are 2+ on the right side and 2+ on the left side. Psychiatric:        Attention and Perception: Attention normal.        Mood and Affect: Mood normal.        Speech: Speech normal.        Behavior: Behavior normal. Behavior is cooperative.        Thought Content: Thought content normal.    Results for orders placed or performed in visit on 07/13/23  Comprehensive metabolic panel   Collection Time: 07/13/23  9:33 AM  Result Value Ref Range   Glucose 83 70 - 99  mg/dL   BUN 12 6 - 24 mg/dL   Creatinine, Ser 8.65 0.76 - 1.27 mg/dL   eGFR 87 >78 IO/NGE/9.52   BUN/Creatinine Ratio 12 9 - 20   Sodium 141 134 - 144 mmol/L   Potassium 3.9 3.5 - 5.2 mmol/L   Chloride 102 96 - 106 mmol/L   CO2 25 20 - 29 mmol/L   Calcium 8.9 8.7 - 10.2 mg/dL   Total Protein 6.8 6.0 - 8.5 g/dL   Albumin 4.7 3.8 - 4.9 g/dL   Globulin, Total 2.1 1.5 - 4.5 g/dL   Bilirubin Total 0.3 0.0 - 1.2 mg/dL   Alkaline Phosphatase 112 44 - 121 IU/L   AST 23 0 - 40 IU/L   ALT 20 0 - 44 IU/L  Lipid Panel w/o Chol/HDL Ratio   Collection Time: 07/13/23  9:33 AM  Result Value Ref Range   Cholesterol, Total 153 100 - 199 mg/dL   Triglycerides 841 0 - 149 mg/dL   HDL 40 >32 mg/dL   VLDL Cholesterol Cal 23 5 - 40 mg/dL   LDL Chol Calc (NIH) 90 0 - 99 mg/dL      Assessment & Plan:   Problem List Items Addressed This Visit       Cardiovascular and Mediastinum   Essential hypertension - Primary   Chronic, improved with increase in Amlodipine. BP at goal in office today.  Will continue Amlodipine 10 MG daily, educated patient on this. May reduce back down in future if BP reduces again with weight loss.  Recommend he monitor BP at least a few mornings a week at home and document.  DASH diet at home.  Labs today: up to date.  Can restart an ACE or ARB in future as needed.         Other Visit Diagnoses       Colon cancer screening       Referral to GI.   Relevant Orders   Ambulatory referral to Gastroenterology        Follow up plan: Return in about 3 months (around 11/29/2023) for Annual Physical  after 11/28/23.

## 2023-08-20 NOTE — Assessment & Plan Note (Signed)
Chronic, improved with increase in Amlodipine. BP at goal in office today.  Will continue Amlodipine 10 MG daily, educated patient on this. May reduce back down in future if BP reduces again with weight loss.  Recommend he monitor BP at least a few mornings a week at home and document.  DASH diet at home.  Labs today: up to date.  Can restart an ACE or ARB in future as needed.

## 2023-08-24 ENCOUNTER — Telehealth: Payer: Self-pay

## 2023-08-24 NOTE — Telephone Encounter (Signed)
The patient called in to schedule a repeat colonoscopy. Dr.  Pasty Spillers I recommend you have a repeat colonoscopy in 1 year, with a 2 day prep, to determine if you have developed any new polyps and to screen for colorectal cancer. The patient said that he gets a colonoscopy every. He called Marcelino Duster this morning and he didn't receive a call back.

## 2023-08-25 ENCOUNTER — Other Ambulatory Visit: Payer: Self-pay

## 2023-08-25 ENCOUNTER — Telehealth: Payer: Self-pay

## 2023-08-25 DIAGNOSIS — Z860100 Personal history of colon polyps, unspecified: Secondary | ICD-10-CM

## 2023-08-25 MED ORDER — PEG 3350-KCL-NA BICARB-NACL 420 G PO SOLR: 4000.0000 mL | Freq: Once | ORAL | 0 refills | Status: AC

## 2023-08-25 NOTE — Telephone Encounter (Signed)
Gastroenterology Pre-Procedure Review  Request Date: 09/10/23 Requesting Physician: Dr. Allegra Lai  PATIENT REVIEW QUESTIONS: The patient responded to the following health history questions as indicated:    1. Are you having any GI issues? no 2. Do you have a personal history of Polyps? yes (last colonoscopy performed by Dr. Maximino Greenland 08/22/20 recommended repeat in 1 year with a 2 day prep.  Asked patient if he had any problems with the prep process.  He stated he did not, however he did not follow the diet guidelines completely.  Since he does not have any problems with constipation offered larger volume prep instead of 2 day as recommended.) 3. Do you have a family history of Colon Cancer or Polyps? no 4. Diabetes Mellitus? no 5. Joint replacements in the past 12 months?no 6. Major health problems in the past 3 months?no 7. Any artificial heart valves, MVP, or defibrillator?no    MEDICATIONS & ALLERGIES:    Patient reports the following regarding taking any anticoagulation/antiplatelet therapy:   Plavix, Coumadin, Eliquis, Xarelto, Lovenox, Pradaxa, Brilinta, or Effient? no Aspirin? no  Patient confirms/reports the following medications:  Current Outpatient Medications  Medication Sig Dispense Refill   albuterol (VENTOLIN HFA) 108 (90 Base) MCG/ACT inhaler Inhale 1-2 puffs into the lungs every 6 (six) hours as needed. 18 g 0   allopurinol (ZYLOPRIM) 100 MG tablet Take 1 tablet (100 mg total) by mouth daily. 90 tablet 2   amLODipine (NORVASC) 10 MG tablet Take 1 tablet (10 mg total) by mouth daily. 90 tablet 4   ciclopirox (PENLAC) 8 % solution Apply topically at bedtime. Apply over nail and surrounding skin. Apply daily over previous coat. After seven (7) days, may remove with alcohol and continue cycle. 6.6 mL 2   colchicine 0.6 MG tablet Take 2 tablets immediately, then 1 tablet twice daily for up to 7 days max. Discontinue if you develop stomach pains or diarrhea. 60 tablet 3   ibuprofen  (ADVIL) 600 MG tablet Take 1 tablet (600 mg total) by mouth every 8 (eight) hours as needed. 30 tablet 0   methocarbamol (ROBAXIN-750) 750 MG tablet Take 1 tablet (750 mg total) by mouth every 8 (eight) hours as needed for muscle spasms. 30 tablet 0   rosuvastatin (CRESTOR) 10 MG tablet Take 1 tablet (10 mg total) by mouth daily. 90 tablet 4   No current facility-administered medications for this visit.    Patient confirms/reports the following allergies:  Allergies  Allergen Reactions   Milk-Related Compounds    Peanut-Containing Drug Products    Amoxicillin Rash    No orders of the defined types were placed in this encounter.   AUTHORIZATION INFORMATION Primary Insurance: 1D#: Group #:  Secondary Insurance: 1D#: Group #:  SCHEDULE INFORMATION: Date: 09/10/23 Time: Location: [facility]

## 2023-09-10 ENCOUNTER — Ambulatory Visit: Payer: BC Managed Care – PPO | Admitting: Certified Registered Nurse Anesthetist

## 2023-09-10 ENCOUNTER — Other Ambulatory Visit: Payer: Self-pay

## 2023-09-10 ENCOUNTER — Encounter: Payer: Self-pay | Admitting: Gastroenterology

## 2023-09-10 ENCOUNTER — Ambulatory Visit
Admission: RE | Admit: 2023-09-10 | Discharge: 2023-09-10 | Disposition: A | Discharge status: Home / Self Care | Payer: BC Managed Care – PPO | Attending: Gastroenterology | Admitting: Gastroenterology

## 2023-09-10 ENCOUNTER — Encounter
Admission: RE | Disposition: A | Discharge status: Home / Self Care | Payer: Self-pay | Source: Home / Self Care | Attending: Gastroenterology

## 2023-09-10 DIAGNOSIS — Z860100 Personal history of colon polyps, unspecified: Secondary | ICD-10-CM

## 2023-09-10 DIAGNOSIS — Z538 Procedure and treatment not carried out for other reasons: Secondary | ICD-10-CM | POA: Insufficient documentation

## 2023-09-10 DIAGNOSIS — Z1211 Encounter for screening for malignant neoplasm of colon: Secondary | ICD-10-CM | POA: Insufficient documentation

## 2023-09-10 HISTORY — PX: COLONOSCOPY WITH PROPOFOL: SHX5780

## 2023-09-10 SURGERY — COLONOSCOPY WITH PROPOFOL
Anesthesia: General

## 2023-09-10 MED ORDER — SODIUM CHLORIDE 0.9% FLUSH: 3.0000 mL | INTRAVENOUS | Status: DC | PRN

## 2023-09-10 MED ORDER — GOLYTELY 236 G PO SOLR: 4000.0000 mL | Freq: Once | ORAL | 0 refills | Status: AC

## 2023-09-10 MED ORDER — PROPOFOL 500 MG/50ML IV EMUL
INTRAVENOUS | Status: DC | PRN
  Administered 2023-09-10: 160 ug/kg/min via INTRAVENOUS

## 2023-09-10 MED ORDER — SODIUM CHLORIDE 0.9 % IV SOLN: INTRAVENOUS | Status: DC

## 2023-09-10 MED ORDER — PROPOFOL 10 MG/ML IV BOLUS
INTRAVENOUS | Status: DC | PRN
  Administered 2023-09-10: 80 mg via INTRAVENOUS
  Administered 2023-09-10: 40 mg via INTRAVENOUS

## 2023-09-10 MED ORDER — SODIUM CHLORIDE 0.9% FLUSH: 3.0000 mL | Freq: Two times a day (BID) | INTRAVENOUS | Status: DC

## 2023-09-10 NOTE — Brief Op Note (Signed)
Colonoscopy aborted at descending colon due to poor prep

## 2023-09-10 NOTE — Progress Notes (Signed)
Per Dr. Allegra Lai schedule patient for colonoscopy for tomorrow because he was not clean out today. She said send large volume prep to the pharmacy. Sent to the pharmacy for prep put in orders

## 2023-09-10 NOTE — Anesthesia Procedure Notes (Signed)
Date/Time: 09/10/2023 10:44 AM  Performed by: Malva Cogan, CRNAPre-anesthesia Checklist: Patient identified, Emergency Drugs available, Suction available, Patient being monitored and Timeout performed Patient Re-evaluated:Patient Re-evaluated prior to induction Oxygen Delivery Method: Nasal cannula Induction Type: IV induction Placement Confirmation: CO2 detector and positive ETCO2

## 2023-09-10 NOTE — Anesthesia Preprocedure Evaluation (Signed)
Anesthesia Evaluation  Patient identified by MRN, date of birth, ID band Patient awake    Reviewed: Allergy & Precautions, H&P , NPO status , Patient's Chart, lab work & pertinent test results  Airway Mallampati: III  TM Distance: >3 FB Neck ROM: Full    Dental no notable dental hx.    Pulmonary former smoker   Pulmonary exam normal breath sounds clear to auscultation       Cardiovascular hypertension, Normal cardiovascular exam Rhythm:Regular Rate:Normal     Neuro/Psych  PSYCHIATRIC DISORDERS Anxiety Depression    negative neurological ROS  negative psych ROS   GI/Hepatic negative GI ROS, Neg liver ROS,,,  Endo/Other  negative endocrine ROS    Renal/GU negative Renal ROS  negative genitourinary   Musculoskeletal negative musculoskeletal ROS (+)    Abdominal   Peds negative pediatric ROS (+)  Hematology negative hematology ROS (+)   Anesthesia Other Findings Depression Anxiety HTN allergies Recent lingular pneumonia January 08-2023  Reproductive/Obstetrics negative OB ROS                             Anesthesia Physical Anesthesia Plan  ASA: 2  Anesthesia Plan: General   Post-op Pain Management:    Induction: Intravenous  PONV Risk Score and Plan:   Airway Management Planned: Natural Airway and Nasal Cannula  Additional Equipment:   Intra-op Plan:   Post-operative Plan:   Informed Consent: I have reviewed the patients History and Physical, chart, labs and discussed the procedure including the risks, benefits and alternatives for the proposed anesthesia with the patient or authorized representative who has indicated his/her understanding and acceptance.     Dental Advisory Given  Plan Discussed with: Anesthesiologist, CRNA and Surgeon  Anesthesia Plan Comments: (Patient consented for risks of anesthesia including but not limited to:  - adverse reactions to  medications - risk of airway placement if required - damage to eyes, teeth, lips or other oral mucosa - nerve damage due to positioning  - sore throat or hoarseness - Damage to heart, brain, nerves, lungs, other parts of body or loss of life  Patient voiced understanding and assent.)        Anesthesia Quick Evaluation

## 2023-09-10 NOTE — Anesthesia Preprocedure Evaluation (Signed)
Anesthesia Evaluation  Patient identified by MRN, date of birth, ID band Patient awake    Reviewed: Allergy & Precautions, NPO status , Patient's Chart, lab work & pertinent test results  History of Anesthesia Complications Negative for: history of anesthetic complications  Airway Mallampati: III  TM Distance: >3 FB Neck ROM: full    Dental no notable dental hx.    Pulmonary neg pulmonary ROS, former smoker   Pulmonary exam normal        Cardiovascular hypertension, On Medications negative cardio ROS Normal cardiovascular exam     Neuro/Psych  PSYCHIATRIC DISORDERS Anxiety Depression    negative neurological ROS     GI/Hepatic negative GI ROS, Neg liver ROS,,,  Endo/Other  negative endocrine ROS    Renal/GU negative Renal ROS  negative genitourinary   Musculoskeletal   Abdominal   Peds  Hematology negative hematology ROS (+)   Anesthesia Other Findings Past Medical History: No date: Allergy No date: Anxiety No date: Depression No date: Hypercholesteremia No date: Hypertension  Past Surgical History: No date: ANKLE FRACTURE SURGERY     Comment:  right No date: APPENDECTOMY 08/22/2020: COLONOSCOPY WITH PROPOFOL; N/A     Comment:  Procedure: COLONOSCOPY WITH PROPOFOL;  Surgeon:               Pasty Spillers, MD;  Location: [facility] ENDOSCOPY;                Service: Endoscopy;  Laterality: N/A; No date: FRACTURE SURGERY     Reproductive/Obstetrics negative OB ROS                             Anesthesia Physical Anesthesia Plan  ASA: 2  Anesthesia Plan: General   Post-op Pain Management: Minimal or no pain anticipated   Induction: Intravenous  PONV Risk Score and Plan: 1 and Propofol infusion and TIVA  Airway Management Planned: Natural Airway and Nasal Cannula  Additional Equipment:   Intra-op Plan:   Post-operative Plan:   Informed Consent: I have reviewed the  patients History and Physical, chart, labs and discussed the procedure including the risks, benefits and alternatives for the proposed anesthesia with the patient or authorized representative who has indicated his/her understanding and acceptance.     Dental Advisory Given  Plan Discussed with: Anesthesiologist, CRNA and Surgeon  Anesthesia Plan Comments: (Patient consented for risks of anesthesia including but not limited to:  - adverse reactions to medications - risk of airway placement if required - damage to eyes, teeth, lips or other oral mucosa - nerve damage due to positioning  - sore throat or hoarseness - Damage to heart, brain, nerves, lungs, other parts of body or loss of life  Patient voiced understanding and assent.)       Anesthesia Quick Evaluation

## 2023-09-10 NOTE — Op Note (Signed)
[facility] Gastroenterology Patient Name: Bryan Mccoy Procedure Date: 09/10/2023 10:43 AM MRN: [id number] Account #: [id number] Date of Birth: 06/17/1971 Admit Type: Outpatient Age: 53 Room: [facility] ENDO ROOM 3 Gender: Male Note Status: Finalized Instrument Name: Colonscope [id number] Procedure:             Colonoscopy Indications:           High risk colon cancer surveillance: Personal history                         of colonic polyps, Last colonoscopy: February 2022 Providers:             Toney Reil MD, MD Referring MD:          Toney Reil MD, MD (Referring MD), Dorie Rank.                         Harvest Dark (Referring MD) Medicines:             General Anesthesia Complications:         No immediate complications. Estimated blood loss: None. Procedure:             Pre-Anesthesia Assessment:                        - Prior to the procedure, a History and Physical was                         performed, and patient medications and allergies were                         reviewed. The patient is competent. The risks and                         benefits of the procedure and the sedation options and                         risks were discussed with the patient. All questions                         were answered and informed consent was obtained.                         Patient identification and proposed procedure were                         verified by the physician, the nurse, the                         anesthesiologist, the anesthetist and the technician                         in the pre-procedure area in the procedure room in the                         endoscopy suite. Mental Status Examination: alert and                         oriented. Airway Examination: normal oropharyngeal  airway and neck mobility. Respiratory Examination:                         clear to auscultation. CV Examination: normal.                          Prophylactic Antibiotics: The patient does not require                         prophylactic antibiotics. Prior Anticoagulants: The                         patient has taken no anticoagulant or antiplatelet                         agents. ASA Grade Assessment: II - A patient with mild                         systemic disease. After reviewing the risks and                         benefits, the patient was deemed in satisfactory                         condition to undergo the procedure. The anesthesia                         plan was to use general anesthesia. Immediately prior                         to administration of medications, the patient was                         re-assessed for adequacy to receive sedatives. The                         heart rate, respiratory rate, oxygen saturations,                         blood pressure, adequacy of pulmonary ventilation, and                         response to care were monitored throughout the                         procedure. The physical status of the patient was                         re-assessed after the procedure.                        After obtaining informed consent, the colonoscope was                         passed under direct vision. Throughout the procedure,                         the patient's blood pressure, pulse, and oxygen  saturations were monitored continuously. The                         Colonoscope was introduced through the anus with the                         intention of advancing to the cecum. The scope was                         advanced to the descending colon before the procedure                         was aborted. Medications were given. The colonoscopy                         was extremely difficult due to inadequate bowel prep.                         The patient tolerated the procedure well. The quality                         of the bowel preparation was poor. The colonoscopy was                          aborted due to poor bowel prep with stool present. Findings:      The perianal and digital rectal examinations were normal. Pertinent       negatives include normal sphincter tone and no palpable rectal lesions.      Copious quantities of semi-liquid stool was found in the rectum, in the       sigmoid colon and in the descending colon, precluding visualization.      Procedure aborted Impression:            - Preparation of the colon was poor.                        - The procedure was aborted due to poor bowel prep                         with stool present.                        - Stool in the rectum, in the sigmoid colon and in the                         descending colon.                        - No specimens collected. Recommendation:        - Discharge patient to home (with escort).                        - Resume previous diet today.                        - Continue present medications.                        - Repeat colonoscopy tomorrow with repeat  prep if pt                         is agreeable because the bowel preparation was                         suboptimal, if not in 3months with 2 day large volume                         bowel prep. Procedure Code(s):     --- Professional ---                        Z6109, 53, Colorectal cancer screening; colonoscopy on                         individual at high risk Diagnosis Code(s):     --- Professional ---                        Z86.010, Personal history of colonic polyps CPT copyright 2022 American Medical Association. All rights reserved. The codes documented in this report are preliminary and upon coder review may  be revised to meet current compliance requirements. Dr. Libby Maw Toney Reil MD, MD 09/10/2023 11:02:58 AM This report has been signed electronically. Number of Addenda: 0 Note Initiated On: 09/10/2023 10:43 AM Total Procedure Duration: 0 hours 9 minutes 20 seconds       [facility]

## 2023-09-10 NOTE — H&P (Signed)
Arlyss Repress, MD [address]  [address]  [place], [place] [address]  Main: [phone]  Fax: [phone] Pager: [phone]  Primary Care Physician:  Marjie Skiff, NP Primary Gastroenterologist:  Dr. Arlyss Repress  Pre-Procedure History & Physical: HPI:  Bryan Mccoy is a 53 y.o. male is here for an colonoscopy.   Past Medical History:  Diagnosis Date   Allergy    Anxiety    Depression    Hypercholesteremia    Hypertension     Past Surgical History:  Procedure Laterality Date   ANKLE FRACTURE SURGERY     right   APPENDECTOMY     COLONOSCOPY WITH PROPOFOL N/A 08/22/2020   Procedure: COLONOSCOPY WITH PROPOFOL;  Surgeon: Pasty Spillers, MD;  Location: [facility] ENDOSCOPY;  Service: Endoscopy;  Laterality: N/A;   FRACTURE SURGERY      Prior to Admission medications   Medication Sig Start Date End Date Taking? Authorizing Provider  albuterol (VENTOLIN HFA) 108 (90 Base) MCG/ACT inhaler Inhale 1-2 puffs into the lungs every 6 (six) hours as needed. 07/23/23   Mickie Bail, NP  allopurinol (ZYLOPRIM) 100 MG tablet Take 1 tablet (100 mg total) by mouth daily. 11/17/22   Cannady, Corrie Dandy T, NP  amLODipine (NORVASC) 10 MG tablet Take 1 tablet (10 mg total) by mouth daily. 07/13/23   Cannady, Corrie Dandy T, NP  ciclopirox (PENLAC) 8 % solution Apply topically at bedtime. Apply over nail and surrounding skin. Apply daily over previous coat. After seven (7) days, may remove with alcohol and continue cycle. 07/13/23   Cannady, Corrie Dandy T, NP  colchicine 0.6 MG tablet Take 2 tablets immediately, then 1 tablet twice daily for up to 7 days max. Discontinue if you develop stomach pains or diarrhea. 11/13/22   Cannady, Corrie Dandy T, NP  ibuprofen (ADVIL) 600 MG tablet Take 1 tablet (600 mg total) by mouth every 8 (eight) hours as needed. 10/15/21   Freddy Finner, NP  methocarbamol (ROBAXIN-750) 750 MG tablet Take 1 tablet (750 mg total) by mouth every 8 (eight) hours as needed for muscle  spasms. 12/03/22   Cannady, Corrie Dandy T, NP  rosuvastatin (CRESTOR) 10 MG tablet Take 1 tablet (10 mg total) by mouth daily. 04/15/23   Aura Dials T, NP    Allergies as of 08/25/2023 - Review Complete 08/20/2023  Allergen Reaction Noted   Milk-related compounds     Peanut-containing drug products     Amoxicillin Rash 07/23/2023    Family History  Problem Relation Age of Onset   Hyperlipidemia Mother    Hypertension Mother    Migraines Sister    Hyperlipidemia Brother    Allergies Son    Lung cancer Maternal Grandfather    Diabetes Paternal Grandmother    Alzheimer's disease Paternal Grandfather     Social History   Socioeconomic History   Marital status: Legally Separated    Spouse name: Not on file   Number of children: 2   Years of education: Not on file   Highest education level: Not on file  Occupational History   Not on file  Tobacco Use   Smoking status: Former    Current packs/day: 0.00    Average packs/day: 1.5 packs/day for 10.0 years (15.0 ttl pk-yrs)    Types: Cigarettes    Start date: 2000    Quit date: 2010    Years since quitting: 15.1   Smokeless tobacco: Never  Vaping Use   Vaping status: Never Used  Substance  and Sexual Activity   Alcohol use: Yes    Comment: very rare   Drug use: Never   Sexual activity: Yes  Other Topics Concern   Not on file  Social History Narrative   Not on file   Social Drivers of Health   Financial Resource Strain: Low Risk  (12/01/2019)   Overall Financial Resource Strain (CARDIA)    Difficulty of Paying Living Expenses: Not hard at all  Food Insecurity: No Food Insecurity (12/01/2019)   Hunger Vital Sign    Worried About Running Out of Food in the Last Year: Never true    Ran Out of Food in the Last Year: Never true  Transportation Needs: No Transportation Needs (12/01/2019)   PRAPARE - Administrator, Civil Service (Medical): No    Lack of Transportation (Non-Medical): No  Physical Activity:  Insufficiently Active (12/01/2019)   Exercise Vital Sign    Days of Exercise per Week: 3 days    Minutes of Exercise per Session: 30 min  Stress: Stress Concern Present (12/01/2019)   [employer] of Occupational Health - Occupational Stress Questionnaire    Feeling of Stress : Rather much  Social Connections: Moderately Integrated (12/01/2019)   Social Connection and Isolation Panel [NHANES]    Frequency of Communication with Friends and Family: Three times a week    Frequency of Social Gatherings with Friends and Family: Three times a week    Attends Religious Services: More than 4 times per year    Active Member of Clubs or Organizations: No    Attends Banker Meetings: Never    Marital Status: Married  Catering manager Violence: Not on file    Review of Systems: See HPI, otherwise negative ROS  Physical Exam: BP (!) 138/97   Pulse 78   Temp (!) 96.7 F (35.9 C) (Temporal)   Resp 17   Wt 98.1 kg   SpO2 99%   BMI 31.29 kg/m  General:   Alert,  pleasant and cooperative in NAD Head:  Normocephalic and atraumatic. Neck:  Supple; no masses or thyromegaly. Lungs:  Clear throughout to auscultation.    Heart:  Regular rate and rhythm. Abdomen:  Soft, nontender and nondistended. Normal bowel sounds, without guarding, and without rebound.   Neurologic:  Alert and  oriented x4;  grossly normal neurologically.  Impression/Plan: Bryan Mccoy is here for a colonoscopy to be performed for h/o colon polyps  Risks, benefits, limitations, and alternatives regarding  colonoscopy have been reviewed with the patient.  Questions have been answered.  All parties agreeable.   [name], MD  09/10/2023, 10:34 AM

## 2023-09-10 NOTE — Transfer of Care (Signed)
Immediate Anesthesia Transfer of Care Note  Patient: Bryan Mccoy  Procedure(s) Performed: COLONOSCOPY WITH PROPOFOL  Patient Location: PACU  Anesthesia Type:General  Level of Consciousness: drowsy  Airway & Oxygen Therapy: Patient Spontanous Breathing  Post-op Assessment: Report given to RN and Post -op Vital signs reviewed and stable  Post vital signs: Reviewed and stable  Last Vitals:  Vitals Value Taken Time  BP 116/76 09/10/23 1102  Temp 35.8 C 09/10/23 1102  Pulse 83 09/10/23 1104  Resp 16 09/10/23 1102  SpO2 97 % 09/10/23 1104  Vitals shown include unfiled device data.  Last Pain:  Vitals:   09/10/23 1102  TempSrc: Temporal  PainSc: Asleep         Complications: No notable events documented.

## 2023-09-10 NOTE — Anesthesia Postprocedure Evaluation (Signed)
Anesthesia Post Note  Patient: Bryan Mccoy  Procedure(s) Performed: COLONOSCOPY WITH PROPOFOL  Patient location during evaluation: Endoscopy Anesthesia Type: General Level of consciousness: awake and alert Pain management: pain level controlled Vital Signs Assessment: post-procedure vital signs reviewed and stable Respiratory status: spontaneous breathing, nonlabored ventilation, respiratory function stable and patient connected to nasal cannula oxygen Cardiovascular status: blood pressure returned to baseline and stable Postop Assessment: no apparent nausea or vomiting Anesthetic complications: no   No notable events documented.   Last Vitals:  Vitals:   09/10/23 1112 09/10/23 1122  BP:    Pulse: 77 73  Resp: 18 18  Temp:    SpO2: 99% 99%    Last Pain:  Vitals:   09/10/23 1122  TempSrc:   PainSc: 0-No pain                 Louie Boston

## 2023-09-11 ENCOUNTER — Ambulatory Visit: Payer: Self-pay | Admitting: Anesthesiology

## 2023-09-11 ENCOUNTER — Encounter
Admission: RE | Disposition: A | Discharge status: Home / Self Care | Payer: Self-pay | Source: Home / Self Care | Attending: Gastroenterology

## 2023-09-11 ENCOUNTER — Encounter: Payer: Self-pay | Admitting: Gastroenterology

## 2023-09-11 ENCOUNTER — Other Ambulatory Visit: Payer: Self-pay

## 2023-09-11 ENCOUNTER — Ambulatory Visit
Admission: RE | Admit: 2023-09-11 | Discharge: 2023-09-11 | Disposition: A | Discharge status: Home / Self Care | Payer: BC Managed Care – PPO | Attending: Gastroenterology | Admitting: Gastroenterology

## 2023-09-11 DIAGNOSIS — Z860100 Personal history of colon polyps, unspecified: Secondary | ICD-10-CM

## 2023-09-11 DIAGNOSIS — Z1211 Encounter for screening for malignant neoplasm of colon: Secondary | ICD-10-CM | POA: Diagnosis not present

## 2023-09-11 DIAGNOSIS — K6389 Other specified diseases of intestine: Secondary | ICD-10-CM | POA: Diagnosis not present

## 2023-09-11 DIAGNOSIS — Z87891 Personal history of nicotine dependence: Secondary | ICD-10-CM | POA: Insufficient documentation

## 2023-09-11 DIAGNOSIS — K6289 Other specified diseases of anus and rectum: Secondary | ICD-10-CM | POA: Insufficient documentation

## 2023-09-11 DIAGNOSIS — I10 Essential (primary) hypertension: Secondary | ICD-10-CM | POA: Insufficient documentation

## 2023-09-11 DIAGNOSIS — K922 Gastrointestinal hemorrhage, unspecified: Secondary | ICD-10-CM | POA: Diagnosis not present

## 2023-09-11 DIAGNOSIS — K639 Disease of intestine, unspecified: Secondary | ICD-10-CM

## 2023-09-11 HISTORY — PX: COLONOSCOPY WITH PROPOFOL: SHX5780

## 2023-09-11 SURGERY — COLONOSCOPY WITH PROPOFOL
Anesthesia: General

## 2023-09-11 MED ORDER — PROPOFOL 10 MG/ML IV BOLUS
INTRAVENOUS | Status: AC
  Filled 2023-09-11: qty 20

## 2023-09-11 MED ORDER — LACTATED RINGERS IV SOLN: INTRAVENOUS | Status: DC

## 2023-09-11 MED ORDER — STERILE WATER FOR IRRIGATION IR SOLN
Status: DC | PRN
  Administered 2023-09-11: 1000 mL

## 2023-09-11 MED ORDER — SODIUM CHLORIDE 0.9 % IV SOLN: INTRAVENOUS | Status: DC

## 2023-09-11 MED ORDER — LIDOCAINE HCL (CARDIAC) PF 100 MG/5ML IV SOSY
PREFILLED_SYRINGE | INTRAVENOUS | Status: DC | PRN
  Administered 2023-09-11: 50 mg via INTRAVENOUS

## 2023-09-11 MED ORDER — PROPOFOL 10 MG/ML IV BOLUS
INTRAVENOUS | Status: DC | PRN
  Administered 2023-09-11 (×4): 50 mg via INTRAVENOUS
  Administered 2023-09-11: 100 mg via INTRAVENOUS
  Administered 2023-09-11: 50 mg via INTRAVENOUS

## 2023-09-11 SURGICAL SUPPLY — 21 items
CLIP HMST 235XBRD CATH ROT (MISCELLANEOUS) IMPLANT
ELECT REM PT RETURN 9FT ADLT (ELECTROSURGICAL)
ELECTRODE REM PT RTRN 9FT ADLT (ELECTROSURGICAL) IMPLANT
FCP ESCP3.2XJMB 240X2.8X (MISCELLANEOUS)
FORCEPS BIOP RAD 4 LRG CAP 4 (CUTTING FORCEPS) IMPLANT
FORCEPS ESCP3.2XJMB 240X2.8X (MISCELLANEOUS) IMPLANT
GOWN CVR UNV OPN BCK APRN NK (MISCELLANEOUS) ×2 IMPLANT
INJECTOR VARIJECT VIN23 (MISCELLANEOUS) IMPLANT
KIT DEFENDO VALVE AND CONN (KITS) IMPLANT
KIT PRC NS LF DISP ENDO (KITS) ×1 IMPLANT
MANIFOLD NEPTUNE II (INSTRUMENTS) ×1 IMPLANT
MARKER SPOT ENDO TATTOO 5ML (MISCELLANEOUS) IMPLANT
PROBE APC STR FIRE (PROBE) IMPLANT
RETRIEVER NET ROTH 2.5X230 LF (MISCELLANEOUS) IMPLANT
SNARE COLD EXACTO (MISCELLANEOUS) IMPLANT
SNARE SHORT THROW 13M SML OVAL (MISCELLANEOUS) IMPLANT
SNARE SHORT THROW 30M LRG OVAL (MISCELLANEOUS) IMPLANT
SNARE SNG USE RND 15MM (INSTRUMENTS) IMPLANT
TRAP ETRAP POLY (MISCELLANEOUS) IMPLANT
VARIJECT INJECTOR VIN23 (MISCELLANEOUS)
WATER STERILE IRR 250ML POUR (IV SOLUTION) ×1 IMPLANT

## 2023-09-11 NOTE — H&P (Signed)
Arlyss Repress, MD [address]  [address]  [place], [place] [address]  Main: [phone]  Fax: [phone] Pager: [phone]  Primary Care Physician:  Marjie Skiff, NP Primary Gastroenterologist:  Dr. Arlyss Repress  Pre-Procedure History & Physical: HPI:  Bryan Mccoy is a 53 y.o. male is here for an colonoscopy.   Past Medical History:  Diagnosis Date   Allergy    Anxiety    Depression    Hypercholesteremia    Hypertension     Past Surgical History:  Procedure Laterality Date   ANKLE FRACTURE SURGERY     right   APPENDECTOMY     COLONOSCOPY WITH PROPOFOL N/A 08/22/2020   Procedure: COLONOSCOPY WITH PROPOFOL;  Surgeon: Pasty Spillers, MD;  Location: [facility] ENDOSCOPY;  Service: Endoscopy;  Laterality: N/A;   COLONOSCOPY WITH PROPOFOL N/A 09/10/2023   Procedure: COLONOSCOPY WITH PROPOFOL;  Surgeon: Toney Reil, MD;  Location: [facility] ENDOSCOPY;  Service: Gastroenterology;  Laterality: N/A;   FRACTURE SURGERY      Prior to Admission medications   Medication Sig Start Date End Date Taking? Authorizing Provider  allopurinol (ZYLOPRIM) 100 MG tablet Take 1 tablet (100 mg total) by mouth daily. 11/17/22  Yes Cannady, Jolene T, NP  amLODipine (NORVASC) 10 MG tablet Take 1 tablet (10 mg total) by mouth daily. 07/13/23  Yes Cannady, Jolene T, NP  colchicine 0.6 MG tablet Take 2 tablets immediately, then 1 tablet twice daily for up to 7 days max. Discontinue if you develop stomach pains or diarrhea. 11/13/22  Yes Cannady, Jolene T, NP  ibuprofen (ADVIL) 600 MG tablet Take 1 tablet (600 mg total) by mouth every 8 (eight) hours as needed. 10/15/21  Yes Freddy Finner, NP  methocarbamol (ROBAXIN-750) 750 MG tablet Take 1 tablet (750 mg total) by mouth every 8 (eight) hours as needed for muscle spasms. 12/03/22  Yes Cannady, Jolene T, NP  rosuvastatin (CRESTOR) 10 MG tablet Take 1 tablet (10 mg total) by mouth daily. 04/15/23  Yes Cannady, Jolene T, NP  albuterol  (VENTOLIN HFA) 108 (90 Base) MCG/ACT inhaler Inhale 1-2 puffs into the lungs every 6 (six) hours as needed. Patient not taking: Reported on 09/10/2023 07/23/23   Mickie Bail, NP  ciclopirox [facility]) 8 % solution Apply topically at bedtime. Apply over nail and surrounding skin. Apply daily over previous coat. After seven (7) days, may remove with alcohol and continue cycle. 07/13/23   Aura Dials T, NP    Allergies as of 09/10/2023 - Review Complete 09/10/2023  Allergen Reaction Noted   Milk-related compounds     Peanut-containing drug products     Amoxicillin Rash 07/23/2023    Family History  Problem Relation Age of Onset   Hyperlipidemia Mother    Hypertension Mother    Migraines Sister    Hyperlipidemia Brother    Allergies Son    Lung cancer Maternal Grandfather    Diabetes Paternal Grandmother    Alzheimer's disease Paternal Grandfather     Social History   Socioeconomic History   Marital status: Legally Separated    Spouse name: Not on file   Number of children: 2   Years of education: Not on file   Highest education level: Not on file  Occupational History   Not on file  Tobacco Use   Smoking status: Former    Current packs/day: 0.00    Average packs/day: 1.5 packs/day for 10.0 years (15.0 ttl pk-yrs)    Types: Cigarettes  Start date: 2000    Quit date: 2010    Years since quitting: 15.1   Smokeless tobacco: Never  Vaping Use   Vaping status: Never Used  Substance and Sexual Activity   Alcohol use: Yes    Comment: very rare   Drug use: Never   Sexual activity: Yes  Other Topics Concern   Not on file  Social History Narrative   Not on file   Social Drivers of Health   Financial Resource Strain: Low Risk  (12/01/2019)   Overall Financial Resource Strain (CARDIA)    Difficulty of Paying Living Expenses: Not hard at all  Food Insecurity: No Food Insecurity (12/01/2019)   Hunger Vital Sign    Worried About Running Out of Food in the Last Year: Never  true    Ran Out of Food in the Last Year: Never true  Transportation Needs: No Transportation Needs (12/01/2019)   PRAPARE - Administrator, Civil Service (Medical): No    Lack of Transportation (Non-Medical): No  Physical Activity: Insufficiently Active (12/01/2019)   Exercise Vital Sign    Days of Exercise per Week: 3 days    Minutes of Exercise per Session: 30 min  Stress: Stress Concern Present (12/01/2019)   [employer] of Occupational Health - Occupational Stress Questionnaire    Feeling of Stress : Rather much  Social Connections: Moderately Integrated (12/01/2019)   Social Connection and Isolation Panel [NHANES]    Frequency of Communication with Friends and Family: Three times a week    Frequency of Social Gatherings with Friends and Family: Three times a week    Attends Religious Services: More than 4 times per year    Active Member of Clubs or Organizations: No    Attends Banker Meetings: Never    Marital Status: Married  Catering manager Violence: Not on file    Review of Systems: See HPI, otherwise negative ROS  Physical Exam: BP (!) 144/87   Pulse 85   Temp 98.6 F (37 C) (Temporal)   Resp 13   Ht 5\' 10"  (1.778 m)   Wt 96.9 kg   SpO2 97%   BMI 30.65 kg/m  General:   Alert,  pleasant and cooperative in NAD Head:  Normocephalic and atraumatic. Neck:  Supple; no masses or thyromegaly. Lungs:  Clear throughout to auscultation.    Heart:  Regular rate and rhythm. Abdomen:  Soft, nontender and nondistended. Normal bowel sounds, without guarding, and without rebound.   Neurologic:  Alert and  oriented x4;  grossly normal neurologically.  Impression/Plan: Bryan Mccoy is here for an colonoscopy to be performed for h/o colon polyps  Risks, benefits, limitations, and alternatives regarding  colonoscopy have been reviewed with the patient.  Questions have been answered.  All parties agreeable.   [name], MD  09/11/2023, 11:04  AM

## 2023-09-11 NOTE — Anesthesia Postprocedure Evaluation (Signed)
Anesthesia Post Note  Patient: Bryan Mccoy  Procedure(s) Performed: COLONOSCOPY WITH PROPOFOL  Patient location during evaluation: PACU Anesthesia Type: General Level of consciousness: awake and alert Pain management: pain level controlled Vital Signs Assessment: post-procedure vital signs reviewed and stable Respiratory status: spontaneous breathing, nonlabored ventilation, respiratory function stable and patient connected to nasal cannula oxygen Cardiovascular status: blood pressure returned to baseline and stable Postop Assessment: no apparent nausea or vomiting Anesthetic complications: no   No notable events documented.   Last Vitals:  Vitals:   09/11/23 1300 09/11/23 1305  BP: 111/78 (!) 133/93  Pulse: 82 86  Resp: 14 16  Temp:  36.5 C  SpO2: 95% 96%    Last Pain:  Vitals:   09/11/23 1305  TempSrc:   PainSc: 0-No pain                 Marty Sadlowski C Darol Cush

## 2023-09-11 NOTE — Transfer of Care (Signed)
Immediate Anesthesia Transfer of Care Note  Patient: Bryan Mccoy  Procedure(s) Performed: COLONOSCOPY WITH PROPOFOL  Patient Location: PACU  Anesthesia Type: General  Level of Consciousness: awake, alert  and patient cooperative  Airway and Oxygen Therapy: Patient Spontanous Breathing and Patient connected to supplemental oxygen  Post-op Assessment: Post-op Vital signs reviewed, Patient's Cardiovascular Status Stable, Respiratory Function Stable, Patent Airway and No signs of Nausea or vomiting  Post-op Vital Signs: Reviewed and stable  Complications: No notable events documented.

## 2023-09-11 NOTE — Op Note (Signed)
[facility] Gastroenterology Patient Name: Bryan Mccoy Procedure Date: 09/11/2023 12:26 PM MRN: [id number] Account #: [id number] Date of Birth: 12-Jul-1971 Admit Type: Outpatient Age: 53 Room: [facility] OR ROOM 01 Gender: Male Note Status: Finalized Instrument Name: [id number] Procedure:             Colonoscopy Indications:           High risk colon cancer surveillance: Personal history                         of colonic polyps, Surveillance: Personal history of                         colonic polyps with unknown histology on last                         colonoscopy (inadequate bowel preparation) less than 3                         years ago, Last colonoscopy: February 2025 Providers:             Toney Reil MD, MD Referring MD:          Dorie Rank. Harvest Dark (Referring MD) Medicines:             General Anesthesia Complications:         No immediate complications. Estimated blood loss: None. Procedure:             Pre-Anesthesia Assessment:                        - Prior to the procedure, a History and Physical was                         performed, and patient medications and allergies were                         reviewed. The patient is competent. The risks and                         benefits of the procedure and the sedation options and                         risks were discussed with the patient. All questions                         were answered and informed consent was obtained.                         Patient identification and proposed procedure were                         verified by the physician, the nurse, the                         anesthesiologist, the anesthetist and the technician                         in the pre-procedure area in the procedure room in the  endoscopy suite. Mental Status Examination: alert and                         oriented. Airway Examination: normal oropharyngeal                         airway and  neck mobility. Respiratory Examination:                         clear to auscultation. CV Examination: normal.                         Prophylactic Antibiotics: The patient does not require                         prophylactic antibiotics. Prior Anticoagulants: The                         patient has taken no anticoagulant or antiplatelet                         agents. ASA Grade Assessment: II - A patient with mild                         systemic disease. After reviewing the risks and                         benefits, the patient was deemed in satisfactory                         condition to undergo the procedure. The anesthesia                         plan was to use general anesthesia. Immediately prior                         to administration of medications, the patient was                         re-assessed for adequacy to receive sedatives. The                         heart rate, respiratory rate, oxygen saturations,                         blood pressure, adequacy of pulmonary ventilation, and                         response to care were monitored throughout the                         procedure. The physical status of the patient was                         re-assessed after the procedure.                        After obtaining informed consent, the colonoscope was  passed under direct vision. Throughout the procedure,                         the patient's blood pressure, pulse, and oxygen                         saturations were monitored continuously. The                         Colonoscope was introduced through the anus and                         advanced to the the cecum, identified by appendiceal                         orifice and ileocecal valve. The colonoscopy was                         performed without difficulty. The patient tolerated                         the procedure well. The quality of the bowel                         preparation  was adequate to identify polyps greater                         than 5 mm in size. The ileocecal valve, appendiceal                         orifice, and rectum were photographed. Findings:      The perianal and digital rectal examinations were normal. Pertinent       negatives include normal sphincter tone and no palpable rectal lesions.      A patchy area of mildly erythematous mucosa was found in the proximal       rectum and in the recto-sigmoid colon. Biopsies were taken with a cold       forceps for histology.      The retroflexed view of the distal rectum and anal verge was normal and       showed no anal or rectal abnormalities.      The exam was otherwise without abnormality. Impression:            - Erythematous mucosa in the proximal rectum and in                         the recto-sigmoid colon. Biopsied.                        - The distal rectum and anal verge are normal on                         retroflexion view.                        - The examination was otherwise normal. Recommendation:        - Discharge patient to home (with escort).                        -  Resume previous diet today.                        - Continue present medications.                        - Await pathology results. Procedure Code(s):     --- Professional ---                        [phone], Colonoscopy, flexible; with biopsy, single or                         multiple Diagnosis Code(s):     --- Professional ---                        K62.89, Other specified diseases of anus and rectum                        K63.89, Other specified diseases of intestine                        Z86.010, Personal history of colonic polyps CPT copyright 2022 American Medical Association. All rights reserved. The codes documented in this report are preliminary and upon coder review may  be revised to meet current compliance requirements. Dr. Libby Maw Toney Reil MD, MD 09/11/2023 12:57:42 PM This report  has been signed electronically. Number of Addenda: 0 Note Initiated On: 09/11/2023 12:26 PM Scope Withdrawal Time: 0 hours 14 minutes 53 seconds  Total Procedure Duration: 0 hours 19 minutes 46 seconds  Estimated Blood Loss:  Estimated blood loss: none.      [facility]

## 2023-09-14 ENCOUNTER — Encounter: Payer: Self-pay | Admitting: Gastroenterology

## 2023-09-14 LAB — SURGICAL PATHOLOGY

## 2023-11-15 NOTE — Patient Instructions (Signed)
Wegovy or Zepbound  Focus on DASH diet for high blood pressure or Mediterranean diet  Be Involved in Caring For Your Health:  Taking Medications When medications are taken as directed, they can greatly improve your health. But if they are not taken as prescribed, they may not work. In some cases, not taking them correctly can be harmful. To help ensure your treatment remains effective and safe, understand your medications and how to take them. Bring your medications to each visit for review by your provider.  Your lab results, notes, and after visit summary will be available on My Chart. We strongly encourage you to use this feature. If lab results are abnormal the clinic will contact you with the appropriate steps. If the clinic does not contact you assume the results are satisfactory. You can always view your results on My Chart. If you have questions regarding your health or results, please contact the clinic during office hours. You can also ask questions on My Chart.  We at [facility] are grateful that you chose us to provide your care. We strive to provide evidence-based and compassionate care and are always looking for feedback. If you get a survey from the clinic please complete this so we can hear your opinions.  Preventing High Cholesterol Cholesterol is a white, waxy substance similar to fat that the human body needs to help build cells. The liver makes all the cholesterol that a person's body needs. Having high cholesterol (hypercholesterolemia) increases your risk for heart disease and stroke. Extra or excess cholesterol comes from the food that you eat. High cholesterol can often be prevented with diet and lifestyle changes. If you already have high cholesterol, you can control it with diet, lifestyle changes, and medicines. How can high cholesterol affect me? If you have high cholesterol, fatty deposits (plaques) may build up on the walls of your blood vessels. The blood  vessels that carry blood away from your heart are called arteries. Plaques make the arteries narrower and stiffer. This in turn can: Restrict or block blood flow and cause blood clots to form. Increase your risk for heart attack and stroke. What can increase my risk for high cholesterol? This condition is more likely to develop in people who: Eat foods that are high in saturated fat or cholesterol. Saturated fat is mostly found in foods that come from animal sources. Are overweight. Are not getting enough exercise. Use products that contain nicotine or tobacco, such as cigarettes, e-cigarettes, and chewing tobacco. Have a family history of high cholesterol (familial hypercholesterolemia). What actions can I take to prevent this? Nutrition  Eat less saturated fat. Avoid trans fats (partially hydrogenated oils). These are often found in margarine and in some baked goods, fried foods, and snacks bought in packages. Avoid precooked or cured meat, such as bacon, sausages, or meat loaves. Avoid foods and drinks that have added sugars. Eat more fruits, vegetables, and whole grains. Choose healthy sources of protein, such as fish, poultry, lean cuts of red meat, beans, peas, lentils, and nuts. Choose healthy sources of fat, such as: Nuts. Vegetable oils, especially olive oil. Fish that have healthy fats, such as omega-3 fatty acids. These fish include mackerel or salmon. Lifestyle Lose weight if you are overweight. Maintaining a healthy body mass index (BMI) can help prevent or control high cholesterol. It can also lower your risk for diabetes and high blood pressure. Ask your health care provider to help you with a diet and exercise plan to lose   weight safely. Do not use any products that contain nicotine or tobacco. These products include cigarettes, chewing tobacco, and vaping devices, such as e-cigarettes. If you need help quitting, ask your health care provider. Alcohol use Do not drink  alcohol if: Your health care provider tells you not to drink. You are pregnant, may be pregnant, or are planning to become pregnant. If you drink alcohol: Limit how much you have to: 0-1 drink a day for women. 0-2 drinks a day for men. Know how much alcohol is in your drink. In the [place] one drink equals one 12 oz bottle of beer (355 mL), one 5 oz glass of wine (148 mL), or one 1 oz glass of hard liquor (44 mL). Activity  Get enough exercise. Do exercises as told by your health care provider. Each week, do at least 150 minutes of exercise that takes a medium level of effort (moderate-intensity exercise). This kind of exercise: Makes your heart beat faster while allowing you to still be able to talk. Can be done in short sessions several times a day or longer sessions a few times a week. For example, on 5 days each week, you could walk fast or ride your bike 3 times a day for 10 minutes each time. Medicines Your health care provider may recommend medicines to help lower cholesterol. This may be a medicine to lower the amount of cholesterol that your liver makes. You may need medicine if: Diet and lifestyle changes have not lowered your cholesterol enough. You have high cholesterol and other risk factors for heart disease or stroke. Take over-the-counter and prescription medicines only as told by your health care provider. General information Manage your risk factors for high cholesterol. Talk with your health care provider about all your risk factors and how to lower your risk. Manage other conditions that you have, such as diabetes or high blood pressure (hypertension). Have blood tests to check your cholesterol levels at regular points in time as told by your health care provider. Keep all follow-up visits. This is important. Where to find more information American Heart Association: www.heart.org National Heart, Lung, and Blood Institute: www.nhlbi.nih.gov Summary High cholesterol  increases your risk for heart disease and stroke. By keeping your cholesterol level low, you can reduce your risk for these conditions. High cholesterol can often be prevented with diet and lifestyle changes. Work with your health care provider to manage your risk factors, and have your blood tested regularly. This information is not intended to replace advice given to you by your health care provider. Make sure you discuss any questions you have with your health care provider. Document Revised: 02/07/2022 Document Reviewed: 09/10/2020 Elsevier Patient Education  2024 Elsevier Inc.  

## 2023-11-16 ENCOUNTER — Encounter: Payer: Self-pay | Admitting: Nurse Practitioner

## 2023-11-20 ENCOUNTER — Ambulatory Visit (INDEPENDENT_AMBULATORY_CARE_PROVIDER_SITE_OTHER): Payer: Self-pay | Admitting: Nurse Practitioner

## 2023-11-20 ENCOUNTER — Encounter: Payer: Self-pay | Admitting: Nurse Practitioner

## 2023-11-20 VITALS — BP 122/80 | HR 70 | Temp 98.5°F | Resp 17 | Ht 70.0 in | Wt 213.2 lb

## 2023-11-20 DIAGNOSIS — N400 Enlarged prostate without lower urinary tract symptoms: Secondary | ICD-10-CM

## 2023-11-20 DIAGNOSIS — Z0000 Encounter for general adult medical examination without abnormal findings: Secondary | ICD-10-CM | POA: Diagnosis not present

## 2023-11-20 DIAGNOSIS — E66811 Obesity, class 1: Secondary | ICD-10-CM

## 2023-11-20 DIAGNOSIS — M1A0720 Idiopathic chronic gout, left ankle and foot, without tophus (tophi): Secondary | ICD-10-CM

## 2023-11-20 DIAGNOSIS — I10 Essential (primary) hypertension: Secondary | ICD-10-CM

## 2023-11-20 DIAGNOSIS — E782 Mixed hyperlipidemia: Secondary | ICD-10-CM | POA: Diagnosis not present

## 2023-11-20 DIAGNOSIS — L600 Ingrowing nail: Secondary | ICD-10-CM | POA: Insufficient documentation

## 2023-11-20 DIAGNOSIS — E6609 Other obesity due to excess calories: Secondary | ICD-10-CM

## 2023-11-20 DIAGNOSIS — Z6831 Body mass index (BMI) 31.0-31.9, adult: Secondary | ICD-10-CM

## 2023-11-20 MED ORDER — AMLODIPINE BESYLATE 10 MG PO TABS: 10.0000 mg | ORAL_TABLET | Freq: Every day | ORAL | 4 refills | Status: AC

## 2023-11-20 MED ORDER — ALLOPURINOL 100 MG PO TABS: 100.0000 mg | ORAL_TABLET | Freq: Every day | ORAL | 4 refills | Status: AC

## 2023-11-20 MED ORDER — ROSUVASTATIN CALCIUM 10 MG PO TABS: 10.0000 mg | ORAL_TABLET | Freq: Every day | ORAL | 4 refills | Status: AC

## 2023-11-20 MED ORDER — SULFAMETHOXAZOLE-TRIMETHOPRIM 800-160 MG PO TABS: 1.0000 | ORAL_TABLET | Freq: Two times a day (BID) | ORAL | 0 refills | Status: AC

## 2023-11-20 NOTE — Assessment & Plan Note (Signed)
Ongoing, stable.  Recheck lipid panel today.  Continue current medication regimen and adjust as needed.

## 2023-11-20 NOTE — Assessment & Plan Note (Signed)
 Chronic, ongoing with no recent flares. Continue Allopurinol  daily and Colchicine  as needed.  Recommend heavy focus on diet at home. Uric acid level today.

## 2023-11-20 NOTE — Assessment & Plan Note (Signed)
 Acute, appears some infection present.  Start Bactrim BID for 5 days.  Referral to podiatry placed for further intervention and recommendations.

## 2023-11-20 NOTE — Assessment & Plan Note (Signed)
 Chronic, stable. BP at goal in office today.  Will continue Amlodipine  10 MG daily, educated patient on this. May reduce back down in future if BP reduces again with weight loss.  Recommend he monitor BP at least a few mornings a week at home and document.  DASH diet at home.  Labs today: CBC, CMP, Lipid.  Can restart an ACE or ARB in future as needed.

## 2023-11-20 NOTE — Progress Notes (Signed)
 BP 122/80 (BP Location: Left Arm, Patient Position: Sitting)   Pulse 70   Temp 98.5 F (36.9 C) (Oral)   Resp 17   Ht 5\' 10"  (1.778 m)   Wt 213 lb 3.2 oz (96.7 kg)   SpO2 97%   BMI 30.59 kg/m    Subjective:    Patient ID: Bryan Mccoy, male    DOB: July 11, 1971, 53 y.o.   MRN: [id number]  HPI: Bryan Mccoy is a 53 y.o. male presenting on 11/20/2023 for comprehensive medical examination. Current medical complaints include:none  He currently lives with: children Interim Problems from his last visit: no  HYPERTENSION / HYPERLIPIDEMIA Takes Amlodipine  daily and Rosuvastatin .  History of gout for which he takes Allopurinol , no recent flares. Satisfied with current treatment? yes Duration of hypertension: chronic BP monitoring frequency: periodically BP range: 130/80 range on average BP medication side effects: no Duration of hyperlipidemia: chronic Aspirin: no Recent stressors: no Recurrent headaches: no Visual changes: no Palpitations: no Dyspnea: no Chest pain: no Lower extremity edema: no Dizzy/lightheaded: no   Functional Status Survey: Is the patient deaf or have difficulty hearing?: No Does the patient have difficulty seeing, even when wearing glasses/contacts?: No Does the patient have difficulty concentrating, remembering, or making decisions?: No Does the patient have difficulty walking or climbing stairs?: No Does the patient have difficulty dressing or bathing?: No Does the patient have difficulty doing errands alone such as visiting a doctor's office or shopping?: No     12/03/2022   10:33 AM 01/12/2023   11:02 AM 07/13/2023    9:04 AM 08/20/2023   10:08 AM 11/20/2023   10:10 AM  Fall Risk  Falls in the past year? 0 0 0 0 0  Was there an injury with Fall? 0 0 0 0 0  Fall Risk Category Calculator 0 0 0 0 0  Patient at Risk for Falls Due to No Fall Risks No Fall Risks No Fall Risks No Fall Risks No Fall Risks  Fall risk Follow up Falls evaluation  completed Falls evaluation completed Falls evaluation completed Falls evaluation completed Falls evaluation completed       11/20/2023   10:10 AM 08/20/2023   10:08 AM 07/13/2023    9:04 AM 01/12/2023   11:02 AM 12/03/2022   10:33 AM  Depression screen PHQ 2/9  Decreased Interest 0 0 0 0 0  Down, Depressed, Hopeless 0 0 0 0 0  PHQ - 2 Score 0 0 0 0 0  Altered sleeping 1 0 0 0 0  Tired, decreased energy 1 1 1  0 1  Change in appetite 1 1 1  0 1  Feeling bad or failure about yourself  0 0 0 0 0  Trouble concentrating 0 0 0 0 0  Moving slowly or fidgety/restless 0 0 0 0 0  Suicidal thoughts 0 0 0 0 0  PHQ-9 Score 3 2 2  0 2  Difficult doing work/chores Somewhat difficult Not difficult at all Somewhat difficult Not difficult at all Somewhat difficult       11/20/2023   10:10 AM 08/20/2023   10:08 AM 07/13/2023    9:05 AM 01/12/2023   11:02 AM  GAD 7 : Generalized Anxiety Score  Nervous, Anxious, on Edge 0 0 0 0  Control/stop worrying 0 0 0 0  Worry too much - different things 0 0 0 0  Trouble relaxing 0 0 0 0  Restless 0 0 0 0  Easily annoyed or irritable 0 0  1 0  Afraid - awful might happen 0 0 0 0  Total GAD 7 Score 0 0 1 0  Anxiety Difficulty  Not difficult at all Not difficult at all Not difficult at all   Past Medical History:  Past Medical History:  Diagnosis Date   Allergy    Anxiety    Depression    Hypercholesteremia    Hypertension     Surgical History:  Past Surgical History:  Procedure Laterality Date   ANKLE FRACTURE SURGERY     right   APPENDECTOMY     COLONOSCOPY WITH PROPOFOL  N/A 08/22/2020   Procedure: COLONOSCOPY WITH PROPOFOL ;  Surgeon: Irby Mannan, MD;  Location: [facility] ENDOSCOPY;  Service: Endoscopy;  Laterality: N/A;   COLONOSCOPY WITH PROPOFOL  N/A 09/10/2023   Procedure: COLONOSCOPY WITH PROPOFOL ;  Surgeon: Selena Daily, MD;  Location: [facility] ENDOSCOPY;  Service: Gastroenterology;  Laterality: N/A;   COLONOSCOPY WITH PROPOFOL  N/A 09/11/2023    Procedure: COLONOSCOPY WITH PROPOFOL ;  Surgeon: Selena Daily, MD;  Location: [facility];  Service: Endoscopy;  Laterality: N/A;   FRACTURE SURGERY      Medications:  Current Outpatient Medications on File Prior to Visit  Medication Sig   albuterol  (VENTOLIN  HFA) 108 (90 Base) MCG/ACT inhaler Inhale 1-2 puffs into the lungs every 6 (six) hours as needed.   ciclopirox  (PENLAC ) 8 % solution Apply topically at bedtime. Apply over nail and surrounding skin. Apply daily over previous coat. After seven (7) days, may remove with alcohol and continue cycle.   colchicine  0.6 MG tablet Take 2 tablets immediately, then 1 tablet twice daily for up to 7 days max. Discontinue if you develop stomach pains or diarrhea.   ibuprofen  (ADVIL ) 600 MG tablet Take 1 tablet (600 mg total) by mouth every 8 (eight) hours as needed.   methocarbamol  (ROBAXIN -750) 750 MG tablet Take 1 tablet (750 mg total) by mouth every 8 (eight) hours as needed for muscle spasms.   No current facility-administered medications on file prior to visit.    Allergies:  Allergies  Allergen Reactions   Milk-Related Compounds    Peanut-Containing Drug Products    Amoxicillin Rash    Social History:  Social History   Socioeconomic History   Marital status: Legally Separated    Spouse name: Not on file   Number of children: 2   Years of education: Not on file   Highest education level: Not on file  Occupational History   Not on file  Tobacco Use   Smoking status: Former    Current packs/day: 0.00    Average packs/day: 1.5 packs/day for 10.0 years (15.0 ttl pk-yrs)    Types: Cigarettes    Start date: 2000    Quit date: 2010    Years since quitting: 15.3   Smokeless tobacco: Never  Vaping Use   Vaping status: Never Used  Substance and Sexual Activity   Alcohol use: Yes    Comment: very rare   Drug use: Never   Sexual activity: Yes  Other Topics Concern   Not on file  Social History Narrative   Not on  file   Social Drivers of Health   Financial Resource Strain: Low Risk  (11/20/2023)   Overall Financial Resource Strain (CARDIA)    Difficulty of Paying Living Expenses: Not hard at all  Food Insecurity: No Food Insecurity (11/20/2023)   Hunger Vital Sign    Worried About Running Out of Food in the Last Year: Never true  Ran Out of Food in the Last Year: Never true  Transportation Needs: No Transportation Needs (11/20/2023)   PRAPARE - Administrator, Civil Service (Medical): No    Lack of Transportation (Non-Medical): No  Physical Activity: Insufficiently Active (11/20/2023)   Exercise Vital Sign    Days of Exercise per Week: 3 days    Minutes of Exercise per Session: 40 min  Stress: No Stress Concern Present (11/20/2023)   [employer] of Occupational Health - Occupational Stress Questionnaire    Feeling of Stress : Not at all  Social Connections: Socially Isolated (11/20/2023)   Social Connection and Isolation Panel [NHANES]    Frequency of Communication with Friends and Family: More than three times a week    Frequency of Social Gatherings with Friends and Family: Twice a week    Attends Religious Services: Never    Database administrator or Organizations: No    Attends Banker Meetings: Never    Marital Status: Divorced  Catering manager Violence: Not At Risk (11/20/2023)   Humiliation, Afraid, Rape, and Kick questionnaire    Fear of Current or Ex-Partner: No    Emotionally Abused: No    Physically Abused: No    Sexually Abused: No   Social History   Tobacco Use  Smoking Status Former   Current packs/day: 0.00   Average packs/day: 1.5 packs/day for 10.0 years (15.0 ttl pk-yrs)   Types: Cigarettes   Start date: 2000   Quit date: 2010   Years since quitting: 15.3  Smokeless Tobacco Never   Social History   Substance and Sexual Activity  Alcohol Use Yes   Comment: very rare    Family History:  Family History  Problem Relation Age of Onset    Hyperlipidemia Mother    Hypertension Mother    Migraines Sister    Hyperlipidemia Brother    Allergies Son    Lung cancer Maternal Grandfather    Diabetes Paternal Grandmother    Alzheimer's disease Paternal Grandfather     Past medical history, surgical history, medications, allergies, family history and social history reviewed with patient today and changes made to appropriate areas of the chart.   ROS All other ROS negative except what is listed above and in the HPI.      Objective:    BP 122/80 (BP Location: Left Arm, Patient Position: Sitting)   Pulse 70   Temp 98.5 F (36.9 C) (Oral)   Resp 17   Ht 5\' 10"  (1.778 m)   Wt 213 lb 3.2 oz (96.7 kg)   SpO2 97%   BMI 30.59 kg/m   Wt Readings from Last 3 Encounters:  11/20/23 213 lb 3.2 oz (96.7 kg)  09/11/23 213 lb 9.6 oz (96.9 kg)  09/10/23 216 lb 3.2 oz (98.1 kg)   Physical Exam Vitals and nursing note reviewed.  Constitutional:      General: He is awake. He is not in acute distress.    Appearance: He is well-developed and well-groomed. He is obese. He is not ill-appearing or toxic-appearing.  HENT:     Head: Normocephalic and atraumatic.     Right Ear: Hearing, tympanic membrane, ear canal and external ear normal. No drainage.     Left Ear: Hearing, tympanic membrane, ear canal and external ear normal. No drainage.     Nose: Nose normal.     Mouth/Throat:     Pharynx: Uvula midline.  Eyes:     General: Lids are normal.  Right eye: No discharge.        Left eye: No discharge.     Extraocular Movements: Extraocular movements intact.     Conjunctiva/sclera: Conjunctivae normal.     Pupils: Pupils are equal, round, and reactive to light.     Visual Fields: Right eye visual fields normal and left eye visual fields normal.  Neck:     Thyroid: No thyromegaly.     Vascular: No carotid bruit or JVD.     Trachea: Trachea normal.  Cardiovascular:     Rate and Rhythm: Normal rate and regular rhythm.      Heart sounds: Normal heart sounds, S1 normal and S2 normal. No murmur heard.    No gallop.  Pulmonary:     Effort: Pulmonary effort is normal. No accessory muscle usage or respiratory distress.     Breath sounds: Normal breath sounds.  Abdominal:     General: Bowel sounds are normal.     Palpations: Abdomen is soft. There is no hepatomegaly or splenomegaly.     Tenderness: There is no abdominal tenderness.  Musculoskeletal:        General: Normal range of motion.     Cervical back: Normal range of motion and neck supple.     Right lower leg: No edema.     Left lower leg: No edema.       Feet:  Lymphadenopathy:     Head:     Right side of head: No submental, submandibular, tonsillar, preauricular or posterior auricular adenopathy.     Left side of head: No submental, submandibular, tonsillar, preauricular or posterior auricular adenopathy.     Cervical: No cervical adenopathy.  Skin:    General: Skin is warm and dry.     Capillary Refill: Capillary refill takes less than 2 seconds.     Findings: No rash.  Neurological:     Mental Status: He is alert and oriented to person, place, and time.     Gait: Gait is intact.     Deep Tendon Reflexes: Reflexes are normal and symmetric.     Reflex Scores:      Brachioradialis reflexes are 2+ on the right side and 2+ on the left side.      Patellar reflexes are 2+ on the right side and 2+ on the left side. Psychiatric:        Attention and Perception: Attention normal.        Mood and Affect: Mood normal.        Speech: Speech normal.        Behavior: Behavior normal. Behavior is cooperative.        Thought Content: Thought content normal.        Cognition and Memory: Cognition normal.     Results for orders placed or performed during the hospital encounter of 09/11/23  Surgical pathology   Collection Time: 09/11/23 12:00 AM  Result Value Ref Range   SURGICAL PATHOLOGY      SURGICAL PATHOLOGY [facility] [address], [address] [place], [place] [address] Telephone [phone] or [phone] Fax [phone]  REPORT OF SURGICAL PATHOLOGY   Accession #: [id number] Patient Name: SHEENA, WURM Visit # : [id number]  MRN: [id number] Physician: Ellis Guys DOB/Age December 23, 1970 (Age: 50) Gender: M Collected Date: 09/11/2023 Received Date: 09/11/2023  FINAL DIAGNOSIS       1. Colon, biopsy, rectal sigmoid erythema :       - COLONIC MUCOSA WITH FOCAL STROMAL  HEMORRHAGE, REACTIVE LYMPHOID AGGREGATES,      AND AREAS OF HYPERPLASTIC EPITHELIAL CHANGE, NON-SPECIFIC.      - NEGATIVE FOR DYSPLASIA AND MALIGNANCY.       DATE SIGNED OUT: 09/14/2023 ELECTRONIC SIGNATURE : Brunetta Capes Md, Alexandria Ida , Pathologist, Electronic Signature  MICROSCOPIC DESCRIPTION  CASE COMMENTS STAINS USED IN DIAGNOSIS: H&E    CLINICAL HISTORY  SPECIMEN(S) OBTAINED 1. Colon, biopsy, Rectal Sigmoid Erythema  SPECIMEN COMMENTS: 1. Histor y of colonic polyps SPECIMEN CLINICAL INFORMATION: 1. Rectal sigmoid erythema    Gross Description 1. "Rectosigmoid erythema", received in formalin is a 1.1 x 0.4 x 0.1 cm aggreggate of multiple tan-white tissue fragments. The specimen is filtered submitted in toto in 1 block (1A).      [facility] 09/11/2023        Report signed out from the following location(s) [place].  HOSP[facility] [address] [place], [place] [address] CLIA #: [id number]  [facility] [address] [place], [place] [address] CLIA #: [id number]       Assessment & Plan:   Problem List Items Addressed This Visit       Cardiovascular and Mediastinum   Essential hypertension - Primary   Chronic, stable. BP at goal in office today.  Will continue Amlodipine  10 MG daily, educated patient on this. May reduce back down in future if BP reduces again with weight loss.  Recommend he monitor BP at least a few mornings a week at home and document.  DASH diet at home.  Labs  today: CBC, CMP, Lipid.  Can restart an ACE or ARB in future as needed.         Relevant Medications   amLODipine  (NORVASC ) 10 MG tablet   rosuvastatin  (CRESTOR ) 10 MG tablet   Other Relevant Orders   CBC with Differential/Platelet   Comprehensive metabolic panel with GFR   TSH     Musculoskeletal and Integument   Ingrown left greater toenail   Acute, appears some infection present.  Start Bactrim BID for 5 days.  Referral to podiatry placed for further intervention and recommendations.      Relevant Orders   Ambulatory referral to Podiatry     Other   Obesity   BMI 30.59, has not been able to exercise lately due to left great toe pain.  Recommended eating smaller high protein, low fat meals more frequently and exercising 30 mins a day 5 times a week with a goal of 10-15lb weight loss in the next 3 months. Patient voiced their understanding and motivation to adhere to these recommendations.       Hyperlipidemia, mixed   Ongoing, stable.  Recheck lipid panel today.  Continue current medication regimen and adjust as needed.      Relevant Medications   amLODipine  (NORVASC ) 10 MG tablet   rosuvastatin  (CRESTOR ) 10 MG tablet   Other Relevant Orders   Comprehensive metabolic panel with GFR   Lipid Panel w/o Chol/HDL Ratio   Gout   Chronic, ongoing with no recent flares. Continue Allopurinol  daily and Colchicine  as needed.  Recommend heavy focus on diet at home. Uric acid level today.      Relevant Orders   Uric acid   Other Visit Diagnoses       Benign prostatic hyperplasia without lower urinary tract symptoms       PSA on labs today.   Relevant Orders   PSA     Encounter for annual physical exam  Annual physical today with labs and health maintenance reviewed, discussed with patient.        Discussed aspirin prophylaxis for myocardial infarction prevention and decision was it was not indicated  LABORATORY TESTING:  Health maintenance labs ordered today as  discussed above.   The natural history of prostate cancer and ongoing controversy regarding screening and potential treatment outcomes of prostate cancer has been discussed with the patient. The meaning of a false positive PSA and a false negative PSA has been discussed. He indicates understanding of the limitations of this screening test and wishes to proceed with screening PSA testing.   IMMUNIZATIONS:   - Tdap: Tetanus vaccination status reviewed: last tetanus booster within 10 years. - Influenza: Refused - Pneumovax: Not applicable - Prevnar: Not applicable - Zostavax vaccine: Up to date  SCREENING: - Colonoscopy: Up To Date Discussed with patient purpose of the colonoscopy is to detect colon cancer at curable precancerous or early stages   - AAA Screening: Not applicable  -Hearing Test: Not applicable  -Spirometry: Not applicable   PATIENT COUNSELING:    Sexuality: Discussed sexually transmitted diseases, partner selection, use of condoms, avoidance of unintended pregnancy  and contraceptive alternatives.   Advised to avoid cigarette smoking.  I discussed with the patient that most people either abstain from alcohol or drink within safe limits (<=14/week and <=4 drinks/occasion for males, <=7/weeks and <= 3 drinks/occasion for females) and that the risk for alcohol disorders and other health effects rises proportionally with the number of drinks per week and how often a drinker exceeds daily limits.  Discussed cessation/primary prevention of drug use and availability of treatment for abuse.   Diet: Encouraged to adjust caloric intake to maintain  or achieve ideal body weight, to reduce intake of dietary saturated fat and total fat, to limit sodium intake by avoiding high sodium foods and not adding table salt, and to maintain adequate dietary potassium and calcium  preferably from fresh fruits, vegetables, and low-fat dairy products.    Stressed the importance of regular  exercise  Injury prevention: Discussed safety belts, safety helmets, smoke detector, smoking near bedding or upholstery.   Dental health: Discussed importance of regular tooth brushing, flossing, and dental visits.   Follow up plan: NEXT PREVENTATIVE PHYSICAL DUE IN 1 YEAR. Return in about 6 months (around 05/22/2024) for HTN/HLD.

## 2023-11-20 NOTE — Assessment & Plan Note (Signed)
 BMI 30.59, has not been able to exercise lately due to left great toe pain.  Recommended eating smaller high protein, low fat meals more frequently and exercising 30 mins a day 5 times a week with a goal of 10-15lb weight loss in the next 3 months. Patient voiced their understanding and motivation to adhere to these recommendations.

## 2023-11-21 ENCOUNTER — Encounter: Payer: Self-pay | Admitting: Nurse Practitioner

## 2023-11-21 LAB — CBC WITH DIFFERENTIAL/PLATELET
Basophils Absolute: 0.1 10*3/uL (ref 0.0–0.2)
Basos: 1 %
EOS (ABSOLUTE): 0.2 10*3/uL (ref 0.0–0.4)
Eos: 2 %
Hematocrit: 44.8 % (ref 37.5–51.0)
Hemoglobin: 15 g/dL (ref 13.0–17.7)
Immature Grans (Abs): 0.1 10*3/uL (ref 0.0–0.1)
Immature Granulocytes: 1 %
Lymphocytes Absolute: 1.7 10*3/uL (ref 0.7–3.1)
Lymphs: 25 %
MCH: 29.4 pg (ref 26.6–33.0)
MCHC: 33.5 g/dL (ref 31.5–35.7)
MCV: 88 fL (ref 79–97)
Monocytes Absolute: 0.4 10*3/uL (ref 0.1–0.9)
Monocytes: 5 %
Neutrophils Absolute: 4.4 10*3/uL (ref 1.4–7.0)
Neutrophils: 66 %
Platelets: 298 10*3/uL (ref 150–450)
RBC: 5.1 x10E6/uL (ref 4.14–5.80)
RDW: 13.3 % (ref 11.6–15.4)
WBC: 6.8 10*3/uL (ref 3.4–10.8)

## 2023-11-21 LAB — LIPID PANEL W/O CHOL/HDL RATIO
Cholesterol, Total: 162 mg/dL (ref 100–199)
HDL: 39 mg/dL — ABNORMAL LOW (ref >39)
LDL Chol Calc (NIH): 91 mg/dL (ref 0–99)
Triglycerides: 185 mg/dL — ABNORMAL HIGH (ref 0–149)
VLDL Cholesterol Cal: 32 mg/dL (ref 5–40)

## 2023-11-21 LAB — COMPREHENSIVE METABOLIC PANEL WITH GFR
ALT: 17 IU/L (ref 0–44)
AST: 21 IU/L (ref 0–40)
Albumin: 4.8 g/dL (ref 3.8–4.9)
Alkaline Phosphatase: 111 IU/L (ref 44–121)
BUN/Creatinine Ratio: 12 (ref 9–20)
BUN: 13 mg/dL (ref 6–24)
Bilirubin Total: 0.3 mg/dL (ref 0.0–1.2)
CO2: 23 mmol/L (ref 20–29)
Calcium: 9.4 mg/dL (ref 8.7–10.2)
Chloride: 102 mmol/L (ref 96–106)
Creatinine, Ser: 1.07 mg/dL (ref 0.76–1.27)
Globulin, Total: 2.5 g/dL (ref 1.5–4.5)
Glucose: 103 mg/dL — ABNORMAL HIGH (ref 70–99)
Potassium: 4.1 mmol/L (ref 3.5–5.2)
Sodium: 143 mmol/L (ref 134–144)
Total Protein: 7.3 g/dL (ref 6.0–8.5)
eGFR: 83 mL/min/{1.73_m2} (ref >59)

## 2023-11-21 LAB — PSA: Prostate Specific Ag, Serum: 0.4 ng/mL (ref 0.0–4.0)

## 2023-11-21 LAB — TSH: TSH: 1.84 u[IU]/mL (ref 0.450–4.500)

## 2023-11-21 LAB — URIC ACID: Uric Acid: 7.2 mg/dL (ref 3.8–8.4)

## 2023-11-21 NOTE — Progress Notes (Signed)
 Contacted via MyChart   Good morning Bryan Mccoy, labs have returned with exception of PSA which is pending.  If abnormal will let you know.   - Kidney function, creatinine and eGFR, remains normal, as is liver function, AST and ALT.  - Lipid panel is showing stable LDL, would like to see <70 in future.  Triglycerides a little elevated, but you did have a small breakfast.:) Continue Rosuvastatin  and we may increase dose at next visit. - Remainder of labs stable.  Any questions? Keep being amazing!!  Thank you for allowing me to participate in your care.  I appreciate you. Kindest regards, Ladawna Walgren

## 2023-11-24 ENCOUNTER — Encounter: Payer: Self-pay | Admitting: Nurse Practitioner

## 2023-12-04 ENCOUNTER — Ambulatory Visit: Admitting: Podiatry

## 2023-12-04 ENCOUNTER — Encounter: Payer: Self-pay | Admitting: Podiatry

## 2023-12-04 DIAGNOSIS — B351 Tinea unguium: Secondary | ICD-10-CM | POA: Diagnosis not present

## 2023-12-04 MED ORDER — TERBINAFINE HCL 250 MG PO TABS: 250.0000 mg | ORAL_TABLET | Freq: Every day | ORAL | 0 refills | Status: AC

## 2023-12-04 NOTE — Progress Notes (Signed)
   Chief Complaint  Patient presents with   Nail Problem    "My toenail keeps falling off and growing back.  It gets painful at times." N - toenail  L - hallux left D - 1-2 mos O - suddenly, dropped daughter's bookbag on it C - was tender, sore, sharp pains, it was red and puffy, nail falls off, discolored, thick A - shoe bothers it sometime, depends how long I'm on my feet T - Medicated Nail Polish - Ciclopirox     Subjective: 53 y.o. male presenting today as a new patient for evaluation of discoloration with thickening to the left hallux nail plate.  About 1.5 years ago he did drop his daughter's backpack on his toenail.  Since that time the toenail has fallen off multiple times.  Currently it is symptomatic and tender.  He has been prescribed topical antifungal in the past with modest improvement  Past Medical History:  Diagnosis Date   Allergy    Anxiety    Depression    Hypercholesteremia    Hypertension     Past Surgical History:  Procedure Laterality Date   ANKLE FRACTURE SURGERY     right   APPENDECTOMY     COLONOSCOPY WITH PROPOFOL  N/A 08/22/2020   Procedure: COLONOSCOPY WITH PROPOFOL ;  Surgeon: Irby Mannan, MD;  Location: [facility] ENDOSCOPY;  Service: Endoscopy;  Laterality: N/A;   COLONOSCOPY WITH PROPOFOL  N/A 09/10/2023   Procedure: COLONOSCOPY WITH PROPOFOL ;  Surgeon: Selena Daily, MD;  Location: [facility] ENDOSCOPY;  Service: Gastroenterology;  Laterality: N/A;   COLONOSCOPY WITH PROPOFOL  N/A 09/11/2023   Procedure: COLONOSCOPY WITH PROPOFOL ;  Surgeon: Selena Daily, MD;  Location: [facility];  Service: Endoscopy;  Laterality: N/A;   FRACTURE SURGERY      Allergies  Allergen Reactions   Milk-Related Compounds    Peanut-Containing Drug Products    Amoxicillin Rash    LT great toe 12/04/2023  Objective: Physical Exam General: The patient is alert and oriented x3 in no acute distress.  Dermatology: Hyperkeratotic, discolored, thickened,  onychodystrophy noted to the left hallux nail plate. Skin is warm, dry and supple bilateral lower extremities. Negative for open lesions or macerations.  Vascular: Palpable pedal pulses bilaterally. No edema or erythema noted. Capillary refill within normal limits.  Neurological: Grossly intact via light touch  Musculoskeletal Exam: No pedal deformity noted  Assessment: #1 Onychomycosis of toenails  Plan of Care:  -Patient was evaluated.  Mechanical debridement of the nail plate was performed today using a nail nipper.  Patient tolerated this well. -Today we discussed different treatment options including oral, topical, and laser antifungal treatment modalities.  We discussed their efficacies and side effects.  Patient opts for oral antifungal treatment modality -Prescription for Lamisil 250 mg #90 daily. Pt denies a history of liver pathology or symptoms.  Patient is otherwise healthy.  CMP 11/20/2023 hepatic function WNL -Return to clinic 6 months   [name], DPM [facility]  Dr. [name], DPM    [address] [place], [place] [address]                Office [phone]  Fax [phone]

## 2024-01-16 ENCOUNTER — Encounter: Payer: Self-pay | Admitting: Podiatry

## 2024-02-10 ENCOUNTER — Encounter: Payer: Self-pay | Admitting: Nurse Practitioner

## 2024-02-16 ENCOUNTER — Other Ambulatory Visit: Payer: Self-pay | Admitting: Medical Genetics

## 2024-02-19 ENCOUNTER — Other Ambulatory Visit
Admission: RE | Admit: 2024-02-19 | Discharge: 2024-02-19 | Disposition: A | Discharge status: Home / Self Care | Payer: Self-pay | Source: Ambulatory Visit | Attending: Medical Genetics | Admitting: Medical Genetics

## 2024-02-19 NOTE — Patient Instructions (Signed)
 Actinic Keratosis An actinic keratosis is a precancerous growth on the skin. If there is more than one, the condition is called actinic keratoses. These growths appear most often on parts of the skin that get a lot of sun exposure, including the: Scalp. Face. Ears. Lips. Upper back. Forearms. Backs of the hands. If left untreated, these growths may develop into a skin cancer called squamous cell carcinoma. It is important to have all these growths checked by a health care provider to determine the best treatment. What are the causes? Actinic keratoses are caused by getting too much ultraviolet (UV) radiation from the sun or other UV light sources. What increases the risk? You are more likely to develop this condition if you: Have light-colored skin or blue eyes. Have blond or red hair. Spend a lot of time in the sun. Do not protect your skin from the sun when outdoors. Are an older person. The risk of developing an actinic keratosis increases with age. What are the signs or symptoms? These growths feel like scaly, rough spots of skin. Symptoms of this condition include growths that may: Be as small as a pinhead or as big as a quarter. Itch, hurt, or feel sensitive. Be skin-colored, light tan, dark tan, pink, or a combination of these colors. In most cases, the growths become red. Have a small piece of pink or gray skin (skin tag) growing from them. It may be easier to notice the growths by feeling them rather than seeing them. Sometimes, actinic keratoses disappear but may return a few days to a few weeks later. How is this diagnosed? This condition is usually diagnosed with a physical exam. A tissue sample may be removed from the growth and examined under a microscope (biopsy). How is this treated? This condition may be treated by: Scraping off the actinic keratosis (curettage). Freezing the actinic keratosis with liquid nitrogen (cryosurgery). This causes the growth to eventually  fall off. Applying medicated creams or gels to destroy the cells in the growth. Applying chemicals to the growth to make the outer layers of skin peel off (chemical peel). Using photodynamic therapy. In this procedure, medicated cream is applied to the actinic keratosis. This cream increases your skin's sensitivity to light. Then, a strong light is aimed at the actinic keratosis to destroy cells in the growth. Follow these instructions at home: Skin care Apply cool, wet cloths (coolcompresses) to the affected areas. Do not scratch your skin. Check your skin regularly for any growths, especially ones that: Start to itch or bleed. Change in size, shape, or color. Caring for the treated area Keep the treated area clean and dry as told by your health care provider. Do not apply any medicine, cream, or lotion to the treated area unless your health care provider tells you to do that. Do not pick at blisters or try to break them open. This can cause infection and scarring. If you have red or irritated skin after treatment, follow instructions from your health care provider about how to take care of the treated area. Make sure you: Wash your hands with soap and water for at least 20 seconds before and after you change your bandage (dressing). If soap and water are not available, use hand sanitizer. Change your dressing as told by your health care provider. If you have red or irritated skin after treatment, check the treated area every day for signs of infection. Check for: Redness, swelling, or pain. Fluid or blood. Warmth. Pus or a bad  smell. Lifestyle Do not use any products that contain nicotine or tobacco. These products include cigarettes, chewing tobacco, and vaping devices, such as e-cigarettes. If you need help quitting, ask your health care provider. Take steps to protect your skin from the sun, such as: Avoiding the sun between 10:00 a.m. and 4:00 p.m. This is when the UV light is the  strongest. Using a sunscreen or sunblock with SPF 30 or greater. Applying sunscreen before you are exposed to sunlight and reapplying as often as told by the instructions on the sunscreen container. Wearing protective gear, including: Sunglasses with UV protection. A hat and clothing that protect your skin from sunlight. Avoiding medicines that increase your sensitivity to sunlight when possible. Avoidingtanning beds and other indoor tanning devices. General instructions Take or apply over-the-counter and prescription medicines only as told by your health care provider. Return to your normal activities as told by your health care provider. Ask your health care provider what activities are safe for you. Have a skin exam done every year by a health care provider who is a skin specialist (dermatologist). Keep all follow-up visits. Your health care provider will want to check that the site has healed after treatment. Contact a health care provider if: You notice any changes or new growths on your skin. You have swelling, pain, or redness around your treated area. You have fluid or blood coming from your treated area. Your treated area feels warm to the touch. You have pus or a bad smell coming from your treated area. You have a fever or chills. You have a blister that becomes large and painful. Summary An actinic keratosis is a precancerous growth on the skin.If left untreated, these growths can develop into skin cancer. Check your skin regularly for any growths, especially growths that start to itch or bleed, or change in size, shape, or color. Take steps to protect your skin from the sun. Contact a health care provider if you notice any changes or new growths on your skin. This information is not intended to replace advice given to you by your health care provider. Make sure you discuss any questions you have with your health care provider. Document Revised: 09/19/2021 Document Reviewed:  09/19/2021 Elsevier Patient Education  2024 [employer].

## 2024-02-23 ENCOUNTER — Encounter: Payer: Self-pay | Admitting: Nurse Practitioner

## 2024-02-23 ENCOUNTER — Ambulatory Visit (INDEPENDENT_AMBULATORY_CARE_PROVIDER_SITE_OTHER): Admitting: Nurse Practitioner

## 2024-02-23 VITALS — BP 132/86 | HR 72 | Temp 97.9°F | Ht 70.0 in | Wt 214.2 lb

## 2024-02-23 DIAGNOSIS — Z808 Family history of malignant neoplasm of other organs or systems: Secondary | ICD-10-CM | POA: Diagnosis not present

## 2024-02-23 DIAGNOSIS — G8929 Other chronic pain: Secondary | ICD-10-CM | POA: Diagnosis not present

## 2024-02-23 DIAGNOSIS — M546 Pain in thoracic spine: Secondary | ICD-10-CM

## 2024-02-23 DIAGNOSIS — L819 Disorder of pigmentation, unspecified: Secondary | ICD-10-CM | POA: Diagnosis not present

## 2024-02-23 NOTE — Progress Notes (Signed)
 BP 132/86   Pulse 72   Temp 97.9 F (36.6 C) (Oral)   Ht 5' 10 (1.778 m)   Wt 214 lb 3.2 oz (97.2 kg)   SpO2 96%   BMI 30.73 kg/m    Subjective:    Patient ID: Bryan Mccoy, male    DOB: 1971-05-16, 53 y.o.   MRN: [id number]  HPI: Bryan Mccoy is a 53 y.o. male  Chief Complaint  Patient presents with   Spot on Face    Patient states he has a couple of places on his face he would like checked. States they are not painful and do not itch.    SKIN LESION Presents to have areas on face assessed, one has been present for 3 weeks. Duration: weeks Location: face Painful: no Itching: no Onset: sudden Context: not changing Associated signs and symptoms: none History of skin cancer: no History of precancerous skin lesions: no Family history of skin cancer: yes  -- melanoma in mother  BACK PAIN Right-sided thoracic area continues to have pain, initial visit for this was 01/12/23. Does have underlying constipation at baseline, drinks lots of water  and eats foods high in fiber. Imaging on 12/03/22 mild concavity of thoracic spine.  Took Colchicine  on weekend and this seemed to help a little, but not 100%. Duration: chronic Mechanism of injury: unknown Location: Right and midline thoracic Onset: gradual Severity: 4/10 present, 8-9/10 on weekend Quality: sharp and aching Frequency: intermittent Radiation: none Aggravating factors: unsure Alleviating factors: diet changes Status: stable Treatments attempted: tried multiple things  Relief with NSAIDs?: moderate Nighttime pain:  on occasion, but lying flat helps Paresthesias / decreased sensation:  no Bowel / bladder incontinence:  no Fevers:  no Dysuria / urinary frequency:  no   Relevant past medical, surgical, family and social history reviewed and updated as indicated. Interim medical history since our last visit reviewed. Allergies and medications reviewed and updated.  Review of Systems  Constitutional:   Negative for activity change, diaphoresis, fatigue and fever.  Respiratory:  Negative for cough, chest tightness, shortness of breath and wheezing.   Cardiovascular:  Negative for chest pain, palpitations and leg swelling.  Gastrointestinal: Negative.   Musculoskeletal:  Positive for back pain.  Skin:  Positive for color change.  Neurological: Negative.   Psychiatric/Behavioral: Negative.      Per HPI unless specifically indicated above     Objective:    BP 132/86   Pulse 72   Temp 97.9 F (36.6 C) (Oral)   Ht 5' 10 (1.778 m)   Wt 214 lb 3.2 oz (97.2 kg)   SpO2 96%   BMI 30.73 kg/m   Wt Readings from Last 3 Encounters:  02/23/24 214 lb 3.2 oz (97.2 kg)  11/20/23 213 lb 3.2 oz (96.7 kg)  09/11/23 213 lb 9.6 oz (96.9 kg)    Physical Exam Vitals and nursing note reviewed.  Constitutional:      General: He is awake. He is not in acute distress.    Appearance: He is well-developed and well-groomed. He is obese. He is not ill-appearing or toxic-appearing.  HENT:     Head: Normocephalic.     Right Ear: Hearing and external ear normal.     Left Ear: Hearing and external ear normal.  Eyes:     General: Lids are normal.     Extraocular Movements: Extraocular movements intact.     Conjunctiva/sclera: Conjunctivae normal.  Neck:     Thyroid: No thyromegaly.  Vascular: No carotid bruit.  Cardiovascular:     Rate and Rhythm: Normal rate and regular rhythm.     Heart sounds: Normal heart sounds. No murmur heard.    No gallop.  Pulmonary:     Effort: Pulmonary effort is normal. No accessory muscle usage or respiratory distress.     Breath sounds: Normal breath sounds.  Abdominal:     General: Bowel sounds are normal. There is no distension.     Palpations: Abdomen is soft.     Tenderness: There is no abdominal tenderness.  Musculoskeletal:     Cervical back: Full passive range of motion without pain.     Thoracic back: No swelling, deformity, spasms, tenderness or bony  tenderness. Normal range of motion.     Right lower leg: No edema.     Left lower leg: No edema.  Lymphadenopathy:     Cervical: No cervical adenopathy.  Skin:    General: Skin is warm.     Capillary Refill: Capillary refill takes less than 2 seconds.      Neurological:     Mental Status: He is alert and oriented to person, place, and time.     Deep Tendon Reflexes: Reflexes are normal and symmetric.     Reflex Scores:      Brachioradialis reflexes are 2+ on the right side and 2+ on the left side.      Patellar reflexes are 2+ on the right side and 2+ on the left side. Psychiatric:        Attention and Perception: Attention normal.        Mood and Affect: Mood normal.        Speech: Speech normal.        Behavior: Behavior normal. Behavior is cooperative.        Thought Content: Thought content normal.     Results for orders placed or performed in visit on 11/20/23  CBC with Differential/Platelet   Collection Time: 11/20/23 10:32 AM  Result Value Ref Range   WBC 6.8 3.4 - 10.8 x10E3/uL   RBC 5.10 4.14 - 5.80 x10E6/uL   Hemoglobin 15.0 13.0 - 17.7 g/dL   Hematocrit 55.1 62.4 - 51.0 %   MCV 88 79 - 97 fL   MCH 29.4 26.6 - 33.0 pg   MCHC 33.5 31.5 - 35.7 g/dL   RDW 86.6 88.3 - 84.5 %   Platelets 298 150 - 450 x10E3/uL   Neutrophils 66 Not Estab. %   Lymphs 25 Not Estab. %   Monocytes 5 Not Estab. %   Eos 2 Not Estab. %   Basos 1 Not Estab. %   Neutrophils Absolute 4.4 1.4 - 7.0 x10E3/uL   Lymphocytes Absolute 1.7 0.7 - 3.1 x10E3/uL   Monocytes Absolute 0.4 0.1 - 0.9 x10E3/uL   EOS (ABSOLUTE) 0.2 0.0 - 0.4 x10E3/uL   Basophils Absolute 0.1 0.0 - 0.2 x10E3/uL   Immature Granulocytes 1 Not Estab. %   Immature Grans (Abs) 0.1 0.0 - 0.1 x10E3/uL  Comprehensive metabolic panel with GFR   Collection Time: 11/20/23 10:32 AM  Result Value Ref Range   Glucose 103 (H) 70 - 99 mg/dL   BUN 13 6 - 24 mg/dL   Creatinine, Ser 8.92 0.76 - 1.27 mg/dL   eGFR 83 >40 fO/fpw/8.26    BUN/Creatinine Ratio 12 9 - 20   Sodium 143 134 - 144 mmol/L   Potassium 4.1 3.5 - 5.2 mmol/L   Chloride 102 96 - 106 mmol/L  CO2 23 20 - 29 mmol/L   Calcium  9.4 8.7 - 10.2 mg/dL   Total Protein 7.3 6.0 - 8.5 g/dL   Albumin 4.8 3.8 - 4.9 g/dL   Globulin, Total 2.5 1.5 - 4.5 g/dL   Bilirubin Total 0.3 0.0 - 1.2 mg/dL   Alkaline Phosphatase 111 44 - 121 IU/L   AST 21 0 - 40 IU/L   ALT 17 0 - 44 IU/L  Lipid Panel w/o Chol/HDL Ratio   Collection Time: 11/20/23 10:32 AM  Result Value Ref Range   Cholesterol, Total 162 100 - 199 mg/dL   Triglycerides 814 (H) 0 - 149 mg/dL   HDL 39 (L) >60 mg/dL   VLDL Cholesterol Cal 32 5 - 40 mg/dL   LDL Chol Calc (NIH) 91 0 - 99 mg/dL  TSH   Collection Time: 11/20/23 10:32 AM  Result Value Ref Range   TSH 1.840 0.450 - 4.500 uIU/mL  PSA   Collection Time: 11/20/23 10:32 AM  Result Value Ref Range   Prostate Specific Ag, Serum 0.4 0.0 - 4.0 ng/mL  Uric acid   Collection Time: 11/20/23 10:32 AM  Result Value Ref Range   Uric Acid 7.2 3.8 - 8.4 mg/dL      Assessment & Plan:   Problem List Items Addressed This Visit       Other   Family history of melanoma   Referral to dermatology for full skin assessment and recommend annual visits with them.      Discoloration of skin - Primary   New onset to face area, 3 areas.  Referral to dermatology for full skin assessment.      Relevant Orders   Ambulatory referral to Dermatology   Chronic right-sided thoracic back pain   Chronic, ongoing for one year now.  Past imaging did show concavity to thoracic spine.  Continue Robaxin  as needed and Lidocaine  patches.  Chiropractor not offering benefit.  Will place referral to ortho for further recommendations. Suspect will have PT ordered. No red flags on exam.      Relevant Orders   Ambulatory referral to Orthopedic Surgery     Follow up plan: Return if symptoms worsen or fail to improve.

## 2024-02-23 NOTE — Assessment & Plan Note (Signed)
 New onset to face area, 3 areas.  Referral to dermatology for full skin assessment.

## 2024-02-23 NOTE — Assessment & Plan Note (Signed)
 Chronic, ongoing for one year now.  Past imaging did show concavity to thoracic spine.  Continue Robaxin  as needed and Lidocaine  patches.  Chiropractor not offering benefit.  Will place referral to ortho for further recommendations. Suspect will have PT ordered. No red flags on exam.

## 2024-02-23 NOTE — Assessment & Plan Note (Signed)
 Referral to dermatology for full skin assessment and recommend annual visits with them.

## 2024-03-01 LAB — GENECONNECT MOLECULAR SCREEN: Genetic Analysis Overall Interpretation: NEGATIVE

## 2024-03-29 DIAGNOSIS — D2330 Other benign neoplasm of skin of unspecified part of face: Secondary | ICD-10-CM | POA: Diagnosis not present

## 2024-03-29 DIAGNOSIS — L738 Other specified follicular disorders: Secondary | ICD-10-CM | POA: Diagnosis not present

## 2024-03-29 DIAGNOSIS — L821 Other seborrheic keratosis: Secondary | ICD-10-CM | POA: Diagnosis not present

## 2024-03-31 ENCOUNTER — Ambulatory Visit: Admitting: Orthopedic Surgery

## 2024-04-01 ENCOUNTER — Ambulatory Visit (INDEPENDENT_AMBULATORY_CARE_PROVIDER_SITE_OTHER)

## 2024-04-01 DIAGNOSIS — M549 Dorsalgia, unspecified: Secondary | ICD-10-CM | POA: Diagnosis not present

## 2024-04-01 DIAGNOSIS — S29012A Strain of muscle and tendon of back wall of thorax, initial encounter: Secondary | ICD-10-CM | POA: Diagnosis not present

## 2024-04-01 NOTE — Progress Notes (Signed)
 Orthopaedic Surgery New Spine Visit   History of Present Illness: The patient is a 53 y.o. male seen in clinic for right sided thoracic back pain. Patient reports symptom onset was sudden, and started 15 years ago. Denies a specific injury or event when symptoms started. This complaint is not work-related. He does note that symptoms are associated with eating food.  As a result, patient reports having quite an extensive gastroenterology workup including procedures without identifiable cause.  Pain is located primarily mid to low back pain toward right paraspinal musculature and scapular border and intermittent in nature. Patient describes the pain as dull and feels like someone is pushing into that area.  Pain is aggravated by food,use of injured area, pressure on injured area, and sitting.  Pain is relieved by rest,laying down.  The patient is not functionally limited in work/home/recreational activities as a result of the symptoms.   Weakness: none Difficulty with fine motor skills (e.g., buttoning shirts, handwriting): none Symptoms of imbalance: none Paresthesias and numbness: none Bowel or bladder incontinence: none Saddle anesthesia: none Symptoms:  -leg cramping: none  -leg heaviness: none  -with improvement upon flexion of back: none      Treatment has included the following: Treatments: NSAID's and/or analgesics   Family present: none.  Occupation: Systems developer Activities: walking and hiking   Spine Surgical History: none  Past Medical, Social and Family History: Past Medical History:  Diagnosis Date   Allergy    Anxiety    Depression    Hypercholesteremia    Hypertension    Past Surgical History:  Procedure Laterality Date   ANKLE FRACTURE SURGERY     right   APPENDECTOMY     COLONOSCOPY WITH PROPOFOL  N/A 08/22/2020   Procedure: COLONOSCOPY WITH PROPOFOL ;  Surgeon: Janalyn Keene NOVAK, MD;  Location: [facility] ENDOSCOPY;  Service: Endoscopy;  Laterality: N/A;    COLONOSCOPY WITH PROPOFOL  N/A 09/10/2023   Procedure: COLONOSCOPY WITH PROPOFOL ;  Surgeon: Unk Corinn Skiff, MD;  Location: [facility] ENDOSCOPY;  Service: Gastroenterology;  Laterality: N/A;   COLONOSCOPY WITH PROPOFOL  N/A 09/11/2023   Procedure: COLONOSCOPY WITH PROPOFOL ;  Surgeon: Unk Corinn Skiff, MD;  Location: [facility];  Service: Endoscopy;  Laterality: N/A;   FRACTURE SURGERY     Allergies  Allergen Reactions   Milk-Related Compounds    Peanut-Containing Drug Products    Amoxicillin Rash   Current Outpatient Medications on File Prior to Visit  Medication Sig Dispense Refill   albuterol  (VENTOLIN  HFA) 108 (90 Base) MCG/ACT inhaler Inhale 1-2 puffs into the lungs every 6 (six) hours as needed. 18 g 0   allopurinol  (ZYLOPRIM ) 100 MG tablet Take 1 tablet (100 mg total) by mouth daily. 90 tablet 4   amLODipine  (NORVASC ) 10 MG tablet Take 1 tablet (10 mg total) by mouth daily. 90 tablet 4   ciclopirox  (PENLAC ) 8 % solution Apply topically at bedtime. Apply over nail and surrounding skin. Apply daily over previous coat. After seven (7) days, may remove with alcohol and continue cycle. 6.6 mL 2   colchicine  0.6 MG tablet Take 2 tablets immediately, then 1 tablet twice daily for up to 7 days max. Discontinue if you develop stomach pains or diarrhea. 60 tablet 3   ibuprofen  (ADVIL ) 600 MG tablet Take 1 tablet (600 mg total) by mouth every 8 (eight) hours as needed. 30 tablet 0   methocarbamol  (ROBAXIN -750) 750 MG tablet Take 1 tablet (750 mg total) by mouth every 8 (eight) hours as needed for muscle spasms. 30  tablet 0   rosuvastatin  (CRESTOR ) 10 MG tablet Take 1 tablet (10 mg total) by mouth daily. 90 tablet 4   terbinafine  (LAMISIL ) 250 MG tablet Take 1 tablet (250 mg total) by mouth daily. 90 tablet 0   No current facility-administered medications on file prior to visit.   Social History   Tobacco Use   Smoking status: Former    Current packs/day: 0.00    Average packs/day:  1.5 packs/day for 10.0 years (15.0 ttl pk-yrs)    Types: Cigarettes    Start date: 2000    Quit date: 2010    Years since quitting: 15.7   Smokeless tobacco: Never  Vaping Use   Vaping status: Never Used  Substance Use Topics   Alcohol use: Not Currently    Comment: very rare   Drug use: Never      I have reviewed past medical, surgical, social and family history, medications and allergies as documented in the EMR.  Review of Systems - A 7 point review of systems was performed and was negative with the exception of the above HPI.    Physical Exam:  General/Constitutional: NAD Vascular: No edema, swelling or tenderness, except as noted in detailed exam Integumentary: No impressive skin lesions present, except as noted in detailed exam Neuro/Psych: Normal mood and affect, oriented to person, place and time Musculoskeletal: Normal, except as noted in detailed exam and in HPI  MSK (spine):  On examination of the posterior thoracic region, skin is intact without obvious deformity.  No palpable step-off deformities around the area of tenderness.  No appreciable scapular winging or atrophy.  Tenderness is specifically noted adjacent to the spinal column in the paraspinal musculature around T11 or T12 on the right side.  There is some tenderness as well about the inferior medial border of the scapula.  Nontender to palpation about the midline spine.  Nontender about the cervical or lumbar regions.   Upon active range of motion about the thoracic spine, rotation and sidebending are within normal limits without restrictions or increase in symptomatology.   Right shoulder range of motion assessment included forward flexion, abduction, internal and external rotation within normal limits without pain.  Protraction and retraction of the right shoulder girdle does elicit some pain in the right paraspinal thoracic region near T11.  Motor strength yields 5/5 in testing of supraspinatus,  infraspinatus and subscapularis.    Vascular/Lymphatic: Right upper extremity warm and well fused   XR Thoracic Spine Imaging: X-rays of the thoracic spine, 3V including AP, flexion and extension lateral from 12/03/22 reviewed which well-maintained vertebral bodies throughout the thoracic region.  Old granulomatous disease noted within the lung field.    Assessment:  Chronic right sided thoracic pain-paraspinal and periscapular nature  Plan: Patient was seen and examined in office today. We reviewed patient's history, examination, and imaging.  Based on information provided, there does seem to be a component that is musculoskeletal in nature.  Other than occasional over-the-counter medication, patient has not trialed other conservative treatment measures.  Therefore, we have recommended a 6-week course of physical therapy focusing on paraspinal and periscapular attention as a good starting point.  All questions, concerns and comments were addressed to the best of my ability.  Follow up in 6 weeks or sooner if needed.     Arlyss GEANNIE Schneider, DO Orthopedic Surgery & Sports Medicine [facility]

## 2024-04-20 DIAGNOSIS — M6281 Muscle weakness (generalized): Secondary | ICD-10-CM | POA: Diagnosis not present

## 2024-04-20 DIAGNOSIS — M546 Pain in thoracic spine: Secondary | ICD-10-CM | POA: Diagnosis not present

## 2024-05-11 DIAGNOSIS — M546 Pain in thoracic spine: Secondary | ICD-10-CM | POA: Diagnosis not present

## 2024-05-11 DIAGNOSIS — M6281 Muscle weakness (generalized): Secondary | ICD-10-CM | POA: Diagnosis not present

## 2024-05-18 DIAGNOSIS — M546 Pain in thoracic spine: Secondary | ICD-10-CM | POA: Diagnosis not present

## 2024-05-18 DIAGNOSIS — M6281 Muscle weakness (generalized): Secondary | ICD-10-CM | POA: Diagnosis not present

## 2024-05-21 NOTE — Patient Instructions (Signed)
 Be Involved in Caring For Your Health:  Taking Medications When medications are taken as directed, they can greatly improve your health. But if they are not taken as prescribed, they may not work. In some cases, not taking them correctly can be harmful. To help ensure your treatment remains effective and safe, understand your medications and how to take them. Bring your medications to each visit for review by your provider.  Your lab results, notes, and after visit summary will be available on My Chart. We strongly encourage you to use this feature. If lab results are abnormal the clinic will contact you with the appropriate steps. If the clinic does not contact you assume the results are satisfactory. You can always view your results on My Chart. If you have questions regarding your health or results, please contact the clinic during office hours. You can also ask questions on My Chart.  We at [facility] are grateful that you chose [place] to provide your care. We strive to provide evidence-based and compassionate care and are always looking for feedback. If you get a survey from the clinic please complete this so we can hear your opinions.  Heart-Healthy Eating Plan Many factors influence your heart health, including eating and exercise habits. Heart health is also called coronary health. Coronary risk increases with abnormal blood fat (lipid) levels. A heart-healthy eating plan includes limiting unhealthy fats, increasing healthy fats, limiting salt (sodium) intake, and making other diet and lifestyle changes. What is my plan? Your health care provider may recommend that: You limit your fat intake to _________% or less of your total calories each day. You limit your saturated fat intake to _________% or less of your total calories each day. You limit the amount of cholesterol in your diet to less than _________ mg per day. You limit the amount of sodium in your diet to less than _________  mg per day. What are tips for following this plan? Cooking Cook foods using methods other than frying. Baking, boiling, grilling, and broiling are all good options. Other ways to reduce fat include: Removing the skin from poultry. Removing all visible fats from meats. Steaming vegetables in water or broth. Meal planning  At meals, imagine dividing your plate into fourths: Fill one-half of your plate with vegetables and green salads. Fill one-fourth of your plate with whole grains. Fill one-fourth of your plate with lean protein foods. Eat 2-4 cups of vegetables per day. One cup of vegetables equals 1 cup (91 g) broccoli or cauliflower florets, 2 medium carrots, 1 large bell pepper, 1 large sweet potato, 1 large tomato, 1 medium white potato, 2 cups (150 g) raw leafy greens. Eat 1-2 cups of fruit per day. One cup of fruit equals 1 small apple, 1 large banana, 1 cup (237 g) mixed fruit, 1 large orange,  cup (82 g) dried fruit, 1 cup (240 mL) 100% fruit juice. Eat more foods that contain soluble fiber. Examples include apples, broccoli, carrots, beans, peas, and barley. Aim to get 25-30 g of fiber per day. Increase your consumption of legumes, nuts, and seeds to 4-5 servings per week. One serving of dried beans or legumes equals  cup (90 g) cooked, 1 serving of nuts is  oz (12 almonds, 24 pistachios, or 7 walnut halves), and 1 serving of seeds equals  oz (8 g). Fats Choose healthy fats more often. Choose monounsaturated and polyunsaturated fats, such as olive and canola oils, avocado oil, flaxseeds, walnuts, almonds, and seeds. Eat  more omega-3 fats. Choose salmon, mackerel, sardines, tuna, flaxseed oil, and ground flaxseeds. Aim to eat fish at least 2 times each week. Check food labels carefully to identify foods with trans fats or high amounts of saturated fat. Limit saturated fats. These are found in animal products, such as meats, butter, and cream. Plant sources of saturated fats  include palm oil, palm kernel oil, and coconut oil. Avoid foods with partially hydrogenated oils in them. These contain trans fats. Examples are stick margarine, some tub margarines, cookies, crackers, and other baked goods. Avoid fried foods. General information Eat more home-cooked food and less restaurant, buffet, and fast food. Limit or avoid alcohol. Limit foods that are high in added sugar and simple starches such as foods made using white refined flour (white breads, pastries, sweets). Lose weight if you are overweight. Losing just 5-10% of your body weight can help your overall health and prevent diseases such as diabetes and heart disease. Monitor your sodium intake, especially if you have high blood pressure. Talk with your health care provider about your sodium intake. Try to incorporate more vegetarian meals weekly. What foods should I eat? Fruits All fresh, canned (in natural juice), or frozen fruits. Vegetables Fresh or frozen vegetables (raw, steamed, roasted, or grilled). Green salads. Grains Most grains. Choose whole wheat and whole grains most of the time. Rice and pasta, including brown rice and pastas made with whole wheat. Meats and other proteins Lean, well-trimmed beef, veal, pork, and lamb. Chicken and [place] without skin. All fish and shellfish. Wild duck, rabbit, pheasant, and venison. Egg whites or low-cholesterol egg substitutes. Dried beans, peas, lentils, and tofu. Seeds and most nuts. Dairy Low-fat or nonfat cheeses, including ricotta and mozzarella. Skim or 1% milk (liquid, powdered, or evaporated). Buttermilk made with low-fat milk. Nonfat or low-fat yogurt. Fats and oils Non-hydrogenated (trans-free) margarines. Vegetable oils, including soybean, sesame, sunflower, olive, avocado, peanut, safflower, corn, canola, and cottonseed. Salad dressings or mayonnaise made with a vegetable oil. Beverages Water (mineral or sparkling). Coffee and tea. Unsweetened ice  tea. Diet beverages. Sweets and desserts Sherbet, gelatin, and fruit ice. Small amounts of dark chocolate. Limit all sweets and desserts. Seasonings and condiments All seasonings and condiments. The items listed above may not be a complete list of foods and beverages you can eat. Contact a dietitian for more options. What foods should I avoid? Fruits Canned fruit in heavy syrup. Fruit in cream or butter sauce. Fried fruit. Limit coconut. Vegetables Vegetables cooked in cheese, cream, or butter sauce. Fried vegetables. Grains Breads made with saturated or trans fats, oils, or whole milk. Croissants. Sweet rolls. Donuts. High-fat crackers, such as cheese crackers and chips. Meats and other proteins Fatty meats, such as hot dogs, ribs, sausage, bacon, rib-eye roast or steak. High-fat deli meats, such as salami and bologna. Caviar. Domestic duck and goose. Organ meats, such as liver. Dairy Cream, sour cream, cream cheese, and creamed cottage cheese. Whole-milk cheeses. Whole or 2% milk (liquid, evaporated, or condensed). Whole buttermilk. Cream sauce or high-fat cheese sauce. Whole-milk yogurt. Fats and oils Meat fat, or shortening. Cocoa butter, hydrogenated oils, palm oil, coconut oil, palm kernel oil. Solid fats and shortenings, including bacon fat, salt pork, lard, and butter. Nondairy cream substitutes. Salad dressings with cheese or sour cream. Beverages Regular sodas and any drinks with added sugar. Sweets and desserts Frosting. Pudding. Cookies. Cakes. Pies. Milk chocolate or white chocolate. Buttered syrups. Full-fat ice cream or ice cream drinks. The items listed above may  not be a complete list of foods and beverages to avoid. Contact a dietitian for more information. Summary Heart-healthy meal planning includes limiting unhealthy fats, increasing healthy fats, limiting salt (sodium) intake and making other diet and lifestyle changes. Lose weight if you are overweight. Losing just  5-10% of your body weight can help your overall health and prevent diseases such as diabetes and heart disease. Focus on eating a balance of foods, including fruits and vegetables, low-fat or nonfat dairy, lean protein, nuts and legumes, whole grains, and heart-healthy oils and fats. This information is not intended to replace advice given to you by your health care provider. Make sure you discuss any questions you have with your health care provider. Document Revised: 08/12/2021 Document Reviewed: 08/12/2021 Elsevier Patient Education  2024 [employer].

## 2024-05-23 ENCOUNTER — Encounter: Payer: Self-pay | Admitting: Radiology

## 2024-05-23 ENCOUNTER — Encounter: Payer: Self-pay | Admitting: Nurse Practitioner

## 2024-05-23 ENCOUNTER — Ambulatory Visit: Admitting: Nurse Practitioner

## 2024-05-23 VITALS — BP 126/80 | HR 75 | Temp 98.5°F | Ht 70.0 in | Wt 213.4 lb

## 2024-05-23 DIAGNOSIS — E6609 Other obesity due to excess calories: Secondary | ICD-10-CM

## 2024-05-23 DIAGNOSIS — Z23 Encounter for immunization: Secondary | ICD-10-CM

## 2024-05-23 DIAGNOSIS — I10 Essential (primary) hypertension: Secondary | ICD-10-CM

## 2024-05-23 DIAGNOSIS — E782 Mixed hyperlipidemia: Secondary | ICD-10-CM

## 2024-05-23 DIAGNOSIS — E66811 Obesity, class 1: Secondary | ICD-10-CM

## 2024-05-23 DIAGNOSIS — Z6831 Body mass index (BMI) 31.0-31.9, adult: Secondary | ICD-10-CM

## 2024-05-23 NOTE — Assessment & Plan Note (Signed)
Ongoing, stable.  Recheck lipid panel today.  Continue current medication regimen and adjust as needed.

## 2024-05-23 NOTE — Assessment & Plan Note (Signed)
 Chronic, stable. BP at goal in office today.  Will continue Amlodipine  10 MG daily, educated patient on this. May reduce back down in future if BP reduces again with weight loss.  Recommend he monitor BP at least a few mornings a week at home and document.  DASH diet at home.  Labs today: BMP.  Can restart an ACE or ARB in future as needed.

## 2024-05-23 NOTE — Progress Notes (Signed)
 BP 126/80 (BP Location: Left Arm, Patient Position: Sitting, Cuff Size: Normal)   Pulse 75   Temp 98.5 F (36.9 C) (Oral)   Ht 5' 10 (1.778 m)   Wt 213 lb 6 oz (96.8 kg)   SpO2 98%   BMI 30.62 kg/m    Subjective:    Patient ID: Bryan Mccoy, male    DOB: 1971/02/22, 53 y.o.   MRN: [id number]  HPI: Bryan Mccoy is a 53 y.o. male  Chief Complaint  Patient presents with   Hypertension   Hyperlipidemia   Covid Vaccine    HYPERTENSION / HYPERLIPIDEMIA Taking Crestor  and Amlodipine .   Satisfied with current treatment? yes Duration of hypertension: chronic BP monitoring frequency: every 3-4 days BP range: 120/80 range on average BP medication side effects: no Duration of hyperlipidemia: chronic Aspirin: no Recent stressors: no Recurrent headaches: no Visual changes: no Palpitations: no Dyspnea: no Chest pain: no Lower extremity edema: no Dizzy/lightheaded: no   Relevant past medical, surgical, family and social history reviewed and updated as indicated. Interim medical history since our last visit reviewed. Allergies and medications reviewed and updated.  Review of Systems  Constitutional:  Negative for activity change, diaphoresis, fatigue and fever.  Respiratory:  Negative for cough, chest tightness, shortness of breath and wheezing.   Cardiovascular:  Negative for chest pain, palpitations and leg swelling.  Gastrointestinal: Negative.   Neurological: Negative.   Psychiatric/Behavioral: Negative.     Per HPI unless specifically indicated above     Objective:    BP 126/80 (BP Location: Left Arm, Patient Position: Sitting, Cuff Size: Normal)   Pulse 75   Temp 98.5 F (36.9 C) (Oral)   Ht 5' 10 (1.778 m)   Wt 213 lb 6 oz (96.8 kg)   SpO2 98%   BMI 30.62 kg/m   Wt Readings from Last 3 Encounters:  05/23/24 213 lb 6 oz (96.8 kg)  02/23/24 214 lb 3.2 oz (97.2 kg)  11/20/23 213 lb 3.2 oz (96.7 kg)    Physical Exam Vitals and nursing note reviewed.   Constitutional:      General: He is awake. He is not in acute distress.    Appearance: He is well-developed and well-groomed. He is not ill-appearing or toxic-appearing.  HENT:     Head: Normocephalic.     Right Ear: Hearing and external ear normal.     Left Ear: Hearing and external ear normal.  Eyes:     General: Lids are normal.     Extraocular Movements: Extraocular movements intact.     Conjunctiva/sclera: Conjunctivae normal.  Neck:     Thyroid: No thyromegaly.     Vascular: No carotid bruit.  Cardiovascular:     Rate and Rhythm: Normal rate and regular rhythm.     Heart sounds: Normal heart sounds. No murmur heard.    No gallop.  Pulmonary:     Effort: No accessory muscle usage or respiratory distress.     Breath sounds: Normal breath sounds.  Abdominal:     General: Bowel sounds are normal. There is no distension.     Palpations: Abdomen is soft.     Tenderness: There is no abdominal tenderness.  Musculoskeletal:     Cervical back: Full passive range of motion without pain.     Right lower leg: No edema.     Left lower leg: No edema.  Lymphadenopathy:     Cervical: No cervical adenopathy.  Skin:    General: Skin is  warm.     Capillary Refill: Capillary refill takes less than 2 seconds.  Neurological:     Mental Status: He is alert and oriented to person, place, and time.     Deep Tendon Reflexes: Reflexes are normal and symmetric.     Reflex Scores:      Brachioradialis reflexes are 2+ on the right side and 2+ on the left side.      Patellar reflexes are 2+ on the right side and 2+ on the left side. Psychiatric:        Attention and Perception: Attention normal.        Mood and Affect: Mood normal.        Speech: Speech normal.        Behavior: Behavior normal. Behavior is cooperative.        Thought Content: Thought content normal.    Results for orders placed or performed during the hospital encounter of 02/19/24  [facility] Molecular Screen - Blood ([facility])   Collection Time: 02/19/24 11:22 AM  Result Value Ref Range   Genetic Analysis Overall Interpretation Negative    Genetic Disease Assessed      This is a screening test and does not detect all pathogenic or likely pathogenic variant(s) in the tested genes; diagnostic testing is recommended for individuals with a personal or family history of heart disease or hereditary cancer. [facility]  Population Screen is a screening test that analyzes 11 genes related to hereditary breast and ovarian cancer (HBOC) syndrome, Lynch syndrome, and familial hypercholesterolemia. This test only reports clinically significant pathogenic and likely  pathogenic variants but does not report variants of uncertain significance (VUS). In addition, analysis of the PMS2 gene excludes exons 11-15, which overlap with a known pseudogene (PMS2CL).    Genetic Analysis Report      No pathogenic or likely pathogenic variants were detected in the genes analyzed by this test.Genetic test results should be interpreted in the context of an individual's personal medical and family history. Alteration to medical management is NOT  recommended based solely on this result. Clinical correlation is advised.Additional Considerations- This is a screening test; individuals may still carry pathogenic or likely pathogenic variant(s) in the tested genes that are not detected by this test.-  For individuals at risk for these or other related conditions based on factors including personal or family history, diagnostic testing is recommended.- The absence of pathogenic or likely pathogenic variant(s) in the analyzed genes, while reassuring,  does not eliminate the possibility of a hereditary condition; there are other variants and genes associated with heart disease and hereditary cancer that are not included in this test.    Genes Tested See Notes    Disclaimer See Notes    Sequencing Location See Notes    Interpretation  Methods and Limitations See Notes       Assessment & Plan:   Problem List Items Addressed This Visit       Cardiovascular and Mediastinum   Essential hypertension - Primary   Chronic, stable. BP at goal in office today.  Will continue Amlodipine  10 MG daily, educated patient on this. May reduce back down in future if BP reduces again with weight loss.  Recommend he monitor BP at least a few mornings a week at home and document.  DASH diet at home.  Labs today: BMP.  Can restart an ACE or ARB in future as needed.         Relevant  Orders   Basic metabolic panel with GFR     Other   Obesity   BMI 30.62.  Recommended eating smaller high protein, low fat meals more frequently and exercising 30 mins a day 5 times a week with a goal of 10-15lb weight loss in the next 3 months. Patient voiced their understanding and motivation to adhere to these recommendations.       Hyperlipidemia, mixed   Ongoing, stable.  Recheck lipid panel today.  Continue current medication regimen and adjust as needed.      Relevant Orders   Lipid Panel w/o Chol/HDL Ratio   Other Visit Diagnoses       COVID-19 vaccine administered       Covid vaccine in office today, educated patient.   Relevant Orders   Pfizer Comirnaty Covid-19 Vaccine 44yrs & older        Follow up plan: Return in about 6 months (around 11/20/2024) for Annual Physical after 11/19/24.

## 2024-05-23 NOTE — Assessment & Plan Note (Signed)
BMI 30.62.  Recommended eating smaller high protein, low fat meals more frequently and exercising 30 mins a day 5 times a week with a goal of 10-15lb weight loss in the next 3 months. Patient voiced their understanding and motivation to adhere to these recommendations.  

## 2024-05-24 ENCOUNTER — Ambulatory Visit: Payer: Self-pay | Admitting: Nurse Practitioner

## 2024-05-24 LAB — BASIC METABOLIC PANEL WITH GFR
BUN/Creatinine Ratio: 13 (ref 9–20)
BUN: 15 mg/dL (ref 6–24)
CO2: 23 mmol/L (ref 20–29)
Calcium: 9.3 mg/dL (ref 8.7–10.2)
Chloride: 103 mmol/L (ref 96–106)
Creatinine, Ser: 1.14 mg/dL (ref 0.76–1.27)
Glucose: 88 mg/dL (ref 70–99)
Potassium: 4.5 mmol/L (ref 3.5–5.2)
Sodium: 142 mmol/L (ref 134–144)
eGFR: 77 mL/min/1.73 (ref >59)

## 2024-05-24 LAB — LIPID PANEL W/O CHOL/HDL RATIO
Cholesterol, Total: 153 mg/dL (ref 100–199)
HDL: 41 mg/dL (ref >39)
LDL Chol Calc (NIH): 84 mg/dL (ref 0–99)
Triglycerides: 161 mg/dL — ABNORMAL HIGH (ref 0–149)
VLDL Cholesterol Cal: 28 mg/dL (ref 5–40)

## 2024-05-24 NOTE — Progress Notes (Signed)
 Contacted via MyChart  Good afternoon Bryan Mccoy, your labs have returned and overall remain stable.  We may adjust Rosuvastatin  next visit to 20 MG daily if LDL remains >70.  For now continue all current medications.  Any questions? Keep being stellar!!  Thank you for allowing me to participate in your care.  I appreciate you. Kindest regards, Krithik Mapel

## 2024-06-01 DIAGNOSIS — M546 Pain in thoracic spine: Secondary | ICD-10-CM | POA: Diagnosis not present

## 2024-06-01 DIAGNOSIS — M6281 Muscle weakness (generalized): Secondary | ICD-10-CM | POA: Diagnosis not present

## 2024-06-21 ENCOUNTER — Encounter: Payer: Self-pay | Admitting: Podiatry

## 2024-06-21 ENCOUNTER — Ambulatory Visit: Admitting: Podiatry

## 2024-06-21 VITALS — Ht 70.0 in | Wt 213.4 lb

## 2024-06-21 DIAGNOSIS — B351 Tinea unguium: Secondary | ICD-10-CM | POA: Diagnosis not present

## 2024-06-21 NOTE — Progress Notes (Signed)
   Chief Complaint  Patient presents with   Nail Problem    Pt is here to f/u on left great toenail due to fungal infection, he states everything is going well, stop taking medication due to stomach issues, states the nail looks and feels better, not sore as it was on his last visit.    Subjective: 53 y.o. male presenting today for follow-up evaluation of nail dystrophy to the left hallux nail plate.  Overall improvement.  He discontinued the oral Lamisil  because of GI upset  Past Medical History:  Diagnosis Date   Allergy    Anxiety    Depression    Hypercholesteremia    Hypertension     Past Surgical History:  Procedure Laterality Date   ANKLE FRACTURE SURGERY     right   APPENDECTOMY     COLONOSCOPY WITH PROPOFOL  N/A 08/22/2020   Procedure: COLONOSCOPY WITH PROPOFOL ;  Surgeon: Janalyn Keene NOVAK, MD;  Location: [facility] ENDOSCOPY;  Service: Endoscopy;  Laterality: N/A;   COLONOSCOPY WITH PROPOFOL  N/A 09/10/2023   Procedure: COLONOSCOPY WITH PROPOFOL ;  Surgeon: Unk Corinn Skiff, MD;  Location: [facility] ENDOSCOPY;  Service: Gastroenterology;  Laterality: N/A;   COLONOSCOPY WITH PROPOFOL  N/A 09/11/2023   Procedure: COLONOSCOPY WITH PROPOFOL ;  Surgeon: Unk Corinn Skiff, MD;  Location: [facility];  Service: Endoscopy;  Laterality: N/A;   FRACTURE SURGERY      Allergies  Allergen Reactions   Milk-Related Compounds    Peanut-Containing Drug Products    Amoxicillin Rash    LT great toe 12/04/2023  Objective: Physical Exam General: The patient is alert and oriented x3 in no acute distress.  Dermatology: Overall significant improvement.  Vascular: Palpable pedal pulses bilaterally. No edema or erythema noted. Capillary refill within normal limits.  Neurological: Grossly intact via light touch  Musculoskeletal Exam: No pedal deformity noted  Assessment: #1 Onychomycosis of toenail  Plan of Care:  -Patient was evaluated.   -The left hallux nail plate was debrided  using a rotary bur.  Overall significant improvement. -He has an active prescription for topical antifungal prescribed from his PCP.  Recommend applying daily over the following 3-5 months as the nail continues to finish growing out -Return to clinic PRN  Thresa EMERSON Sar, DPM [facility]  Dr. Thresa EMERSON Sar, DPM    [address] [place], [place] [address]                Office [phone]  Fax [phone]

## 2024-11-21 ENCOUNTER — Encounter: Admitting: Nurse Practitioner
